# Patient Record
Sex: Male | Born: 1965 | Race: Black or African American | Hispanic: No | Marital: Single | Smoking: Current some day smoker
Health system: Southern US, Community
[De-identification: ages and names within clinical notes are randomized; demographics above are authoritative.]

## PROBLEM LIST (undated history)

## (undated) DIAGNOSIS — G4733 Obstructive sleep apnea (adult) (pediatric): Secondary | ICD-10-CM

## (undated) DIAGNOSIS — Z9989 Dependence on other enabling machines and devices: Secondary | ICD-10-CM

## (undated) DIAGNOSIS — F1910 Other psychoactive substance abuse, uncomplicated: Secondary | ICD-10-CM

---

## 1998-05-11 HISTORY — PX: KNEE SURGERY: SHX244

## 2010-10-19 ENCOUNTER — Emergency Department (HOSPITAL_COMMUNITY)
Admission: EM | Admit: 2010-10-19 | Discharge: 2010-10-19 | Disposition: A | Discharge status: Home / Self Care | Payer: Self-pay | Attending: Emergency Medicine | Admitting: Emergency Medicine

## 2010-10-19 ENCOUNTER — Emergency Department (HOSPITAL_COMMUNITY): Payer: Self-pay

## 2010-10-19 DIAGNOSIS — F3289 Other specified depressive episodes: Secondary | ICD-10-CM | POA: Insufficient documentation

## 2010-10-19 DIAGNOSIS — F329 Major depressive disorder, single episode, unspecified: Secondary | ICD-10-CM | POA: Insufficient documentation

## 2010-10-19 DIAGNOSIS — R42 Dizziness and giddiness: Secondary | ICD-10-CM | POA: Insufficient documentation

## 2010-10-19 DIAGNOSIS — R55 Syncope and collapse: Secondary | ICD-10-CM | POA: Insufficient documentation

## 2010-10-19 DIAGNOSIS — R51 Headache: Secondary | ICD-10-CM | POA: Insufficient documentation

## 2010-10-19 DIAGNOSIS — I10 Essential (primary) hypertension: Secondary | ICD-10-CM | POA: Insufficient documentation

## 2010-10-19 DIAGNOSIS — R232 Flushing: Secondary | ICD-10-CM | POA: Insufficient documentation

## 2010-10-19 LAB — TSH: TSH: 1.053 u[IU]/mL (ref 0.350–4.500)

## 2010-10-19 LAB — POCT I-STAT, CHEM 8
BUN: 16 mg/dL (ref 6–23)
Calcium, Ion: 1.03 mmol/L — ABNORMAL LOW (ref 1.12–1.32)
HCT: 44 % (ref 39.0–52.0)
Hemoglobin: 15 g/dL (ref 13.0–17.0)
Sodium: 140 mEq/L (ref 135–145)
TCO2: 22 mmol/L (ref 0–100)

## 2011-02-11 ENCOUNTER — Ambulatory Visit (INDEPENDENT_AMBULATORY_CARE_PROVIDER_SITE_OTHER): Payer: Self-pay | Admitting: Pulmonary Disease

## 2011-02-11 ENCOUNTER — Encounter: Payer: Self-pay | Admitting: Pulmonary Disease

## 2011-02-11 VITALS — BP 120/70 | HR 85 | Temp 98.2°F | Ht 71.0 in | Wt 391.4 lb

## 2011-02-11 DIAGNOSIS — R0609 Other forms of dyspnea: Secondary | ICD-10-CM

## 2011-02-11 DIAGNOSIS — R0683 Snoring: Secondary | ICD-10-CM

## 2011-02-11 DIAGNOSIS — G4733 Obstructive sleep apnea (adult) (pediatric): Secondary | ICD-10-CM | POA: Insufficient documentation

## 2011-02-11 NOTE — Progress Notes (Signed)
  Subjective:    Patient ID: Jesse Bass, male    DOB: 1965-07-28, 45 y.o.   MRN: [id number]  HPI The patient is a 45 year old male who comes in today as a self-referral for evaluation of possible sleep apnea.  He is morbidly obese, and his fiance has commented on loud snoring as well as an abnormal breathing pattern during sleep.  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  The majority of the mornings, the patient is not rested upon arising.  However, he feels that his alertness during the day is completely normal.  He denies issues with sleepiness in the evening while watching television, and denies any sleepiness while driving.  The patient states that his weight is up over the last 2 years, but he is unsure how much.  His Epworth sleepiness score today is only 4.  Sleep Questionnaire: What time do you typically go to bed?( Between what hours) 10 pm to 11:30 pm How long does it take you to fall asleep? immediately How many times during the night do you wake up? 0 What time do you get out of bed to start your day? 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 (In pounds) Have you ever had a sleep study before? No Do you currently use CPAP? No Do you wear oxygen at any time? No    Review of Systems  Constitutional: Negative for fever and unexpected weight change.  HENT: Negative for ear pain, nosebleeds, congestion, sore throat, rhinorrhea, sneezing, trouble swallowing, dental problem, postnasal drip and sinus pressure.   Eyes: Negative for redness and itching.  Respiratory: Negative for cough, chest tightness, shortness of breath and wheezing.   Cardiovascular: Positive for palpitations. Negative for leg swelling.  Gastrointestinal: Negative for nausea and vomiting.  Genitourinary: Negative for dysuria.  Musculoskeletal: Negative  for joint swelling.  Skin: Negative for rash.  Neurological: Negative for headaches.  Hematological: Does not bruise/bleed easily.  Psychiatric/Behavioral: Negative for dysphoric mood. The patient is not nervous/anxious.        Objective:   Physical Exam Constitutional:  Obese male, no acute distress  HENT:  Nares patent without discharge  Oropharynx without exudate, palate and uvula are thick and elongated.  Eyes:  Perrla, eomi, no scleral icterus  Neck:  No JVD, no TMG  Cardiovascular:  Normal rate, regular rhythm, no rubs or gallops.  No murmurs        Intact distal pulses  Pulmonary :  Normal breath sounds, no stridor or respiratory distress   No rales, rhonchi, or wheezing  Abdominal:  Soft, nondistended, bowel sounds present.  No tenderness noted.   Musculoskeletal:  No lower extremity edema noted.  Lymph Nodes:  No cervical lymphadenopathy noted  Skin:  No cyanosis noted  Neurologic:  Alert, appropriate, moves all 4 extremities without obvious deficit.         Assessment & Plan:

## 2011-02-11 NOTE — Patient Instructions (Signed)
Will do home sleep testing, and call you with results when available.  Work on weight loss

## 2011-02-11 NOTE — Assessment & Plan Note (Signed)
The patient's history is very suggestive of obstructive sleep apnea, however he denies having significant daytime sleepiness.  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  I have had a long discussion with the pt about sleep apnea, including its impact on QOL and CV health.  We'll schedule the patient for a home sleep study given the fact that he does not have insurance.  I feel comfortable using this for evaluation provided all of the measured parameters are adequate at the time of the review of his data.  I have also encouraged the patient to work aggressively on weight loss.

## 2011-02-13 ENCOUNTER — Telehealth: Payer: Self-pay | Admitting: Pulmonary Disease

## 2011-02-13 NOTE — Telephone Encounter (Signed)
Called, spoke with pt.  He is requesting results of home sleep study done 2 nights ago.  Dr. Shelle Iron, have you seen these results yet?

## 2011-02-16 ENCOUNTER — Other Ambulatory Visit: Payer: Self-pay | Admitting: Pulmonary Disease

## 2011-02-16 DIAGNOSIS — G4733 Obstructive sleep apnea (adult) (pediatric): Secondary | ICD-10-CM

## 2011-02-16 NOTE — Telephone Encounter (Signed)
I spoke with pt and he states he would like to be set up on cpap. Please advise Dr. Shelle Iron, thanks  Carver Fila, CMA

## 2011-02-16 NOTE — Telephone Encounter (Signed)
Have tried to call this pt on his only number provided.  Please continue to try and let him know he has severe sleep apnea, and needs to be started on cpap.  If he is agreeable, let me know and I can set up.

## 2011-02-16 NOTE — Telephone Encounter (Signed)
Order sent to [facility].  

## 2011-02-17 NOTE — Telephone Encounter (Signed)
Pt is aware.  

## 2011-02-18 ENCOUNTER — Ambulatory Visit (INDEPENDENT_AMBULATORY_CARE_PROVIDER_SITE_OTHER): Payer: Self-pay | Admitting: Pulmonary Disease

## 2011-02-18 ENCOUNTER — Ambulatory Visit: Payer: Self-pay | Admitting: Pulmonary Disease

## 2011-02-18 DIAGNOSIS — G4733 Obstructive sleep apnea (adult) (pediatric): Secondary | ICD-10-CM

## 2011-02-18 DIAGNOSIS — R0683 Snoring: Secondary | ICD-10-CM

## 2011-02-20 ENCOUNTER — Telehealth: Payer: Self-pay | Admitting: Pulmonary Disease

## 2011-02-20 NOTE — Telephone Encounter (Signed)
Pt is aware we are working to see what's going on. Will forward to [place] so she can document. Please advise rhonda, thanks  Carver Fila, CMA

## 2011-02-20 NOTE — Telephone Encounter (Signed)
lmomtcb to advise rhonda is working on this to see what is going on.

## 2011-02-20 NOTE — Telephone Encounter (Signed)
Pt called back again- pt says he is "perturbed" and wants nurse to call asap. Hazel Sams

## 2011-02-20 NOTE — Telephone Encounter (Signed)
2nd request given to [place] today with [facility] [place]. She is checking on status of order. I will contact patient and let him know, once I hear back for Lecretia.

## 2011-02-20 NOTE — Telephone Encounter (Signed)
Spoke with Mayra Reel at [facility] and she stated that [facility] contacted the patient and mailed patient out a financial form. Pt is aware to complete this and return to [facility]. Once this has been completed [facility] will be able to get patient set up on CPAP. I spoke with patient and he is aware of this. [facility] will contact him to once information has been received. Rhonda J Cobb

## 2011-03-17 ENCOUNTER — Ambulatory Visit (INDEPENDENT_AMBULATORY_CARE_PROVIDER_SITE_OTHER): Payer: Self-pay | Admitting: Pulmonary Disease

## 2011-03-17 ENCOUNTER — Encounter: Payer: Self-pay | Admitting: Pulmonary Disease

## 2011-03-17 ENCOUNTER — Telehealth: Payer: Self-pay | Admitting: Pulmonary Disease

## 2011-03-17 VITALS — BP 140/100 | HR 89 | Temp 98.3°F | Ht 71.0 in | Wt 389.4 lb

## 2011-03-17 DIAGNOSIS — G4733 Obstructive sleep apnea (adult) (pediatric): Secondary | ICD-10-CM

## 2011-03-17 NOTE — Patient Instructions (Signed)
Continue with cpap.  Will optimize pressure on auto mode for the next 2 weeks, and will call you with results.  Work on weight loss followup with me in 6mos.

## 2011-03-17 NOTE — Assessment & Plan Note (Signed)
The pt is much improved on cpap.  He is wearing the device compliantly, and has seen significant improvement in his sleep and daytime alertness.  We have yet to optimize his pressure, and will do this on auto mode for the next 2 weeks.  I have also encouraged him to work aggressively on weight loss Care Plan:  At this point, will arrange for the patient's machine to be changed over to auto mode for 2 weeks to optimize their pressure.  I will review the downloaded data once sent by dme, and also evaluate for compliance, leaks, and residual osa.  I will call the patient and dme to discuss the results, and have the patient's machine set appropriately.  This will serve as the pt's cpap pressure titration.

## 2011-03-17 NOTE — Progress Notes (Signed)
  Subjective:    Patient ID: Jesse Bass, male    DOB: February 12, 1966, 45 y.o.   MRN: [id number]  HPI The patient comes in today for followup of his severe objective sleep apnea.  He has been started on CPAP for an AHI of 87 events per hour, and has done well with the device.  His download today shows good compliance, and no significant mask leak.  The patient denies any issues with the mask fit or pressure tolerance.  He feels that he is sleeping much better, and has seen a significant improvement in daytime alertness.  He denies any sleepiness while driving.   Review of Systems  Constitutional: Negative for fever and unexpected weight change.  HENT: Negative for ear pain, nosebleeds, congestion, sore throat, rhinorrhea, sneezing, trouble swallowing, dental problem, postnasal drip and sinus pressure.   Eyes: Negative for redness and itching.  Respiratory: Negative for cough, chest tightness, shortness of breath and wheezing.   Cardiovascular: Negative for palpitations and leg swelling.  Gastrointestinal: Negative for nausea and vomiting.  Genitourinary: Negative for dysuria.  Musculoskeletal: Negative for joint swelling.  Skin: Negative for rash.  Neurological: Negative for headaches.  Hematological: Does not bruise/bleed easily.  Psychiatric/Behavioral: Negative for dysphoric mood. The patient is not nervous/anxious.        Objective:   Physical Exam Obese male in no acute distress No skin breakdown or pressure necrosis from the CPAP mask Lower extremities without edema, no cyanosis noted Alert and oriented, moves all 4 extremities.  Does not appear sleepy.       Assessment & Plan:

## 2011-03-17 NOTE — Telephone Encounter (Signed)
Called and spoke with pt.  Informed him download from cpap that i got off his machine today will be faxed to # he provided.

## 2011-03-17 NOTE — Telephone Encounter (Signed)
PT CALLED AGAIN. ADDS THAT THE CPAP DATA NEEDS TO BE FAXED ASAP "TODAY" TO [employer]- ATTN: ELOISE. FAX # [phone]. PT SAYS THIS IS "VERY URGENT" SO THAT HE CAN GET A JOB ASAP. Hazel Sams

## 2011-03-19 ENCOUNTER — Telehealth: Payer: Self-pay | Admitting: Pulmonary Disease

## 2011-03-19 NOTE — Telephone Encounter (Signed)
Pt stated that this is imperative to be completed ASAP so that he can get on the road on Saturday.  Pt stated if this could be completed at least by tomorrow, he would greatly appreciate it.    Jesse Bass

## 2011-03-19 NOTE — Telephone Encounter (Signed)
lmomtcb x1 

## 2011-03-20 NOTE — Telephone Encounter (Signed)
Per LORI's note at 9:18, it appears everything is ok with his transportation company.  If not, then I need to know.  If there are issues with DOT, i need to know what they are and what are their expectations.   If advanced put his machine on auto, then it should be self adjusting itself accordingly.   I will get a download in 2 weeks for final pressure adjustment.

## 2011-03-20 NOTE — Telephone Encounter (Signed)
PT CALLED BACK. SAYS HE NEEDS TO SPEAK TO NURSE OR [facility] ASAP "BECAUSE DOT IS GIVING HIM HELL ABOUT THIS". Hazel Sams

## 2011-03-20 NOTE — Telephone Encounter (Signed)
Per Ms. Lucky Cowboy with [employer], the pt's last report shows that the AHI was 38 and per DOT regulations this needs to be below 20 if pt is on therapy. She has received the form that Dr. Shelle Iron filled out stating that the pt is okay to drive and his pressure will be optimized. She stated she will need to know when this has been done for their records.

## 2011-03-20 NOTE — Telephone Encounter (Signed)
Pt still hasn't heard anything. Would like someone to call him back asap. June Leap Lilly

## 2011-03-20 NOTE — Telephone Encounter (Signed)
Called and spoke with pt and  He stated that--he spoke with [facility] and they will download his AHI and send this to the transportation company.  Pt stated that he is feeling great and he is not understanding why the company is still giving him a hard time.  Pt stated that he will call back if anything further is needed.

## 2011-03-20 NOTE — Telephone Encounter (Signed)
Pt calling again in reference to previous message also states that [facility] changed the usage on his cpap machine re: running slow wants to know if dot has spoken with [facility].Juanita Jinny Sanders

## 2011-03-24 ENCOUNTER — Ambulatory Visit: Payer: Self-pay | Admitting: Pulmonary Disease

## 2011-03-25 ENCOUNTER — Encounter: Payer: Self-pay | Admitting: Pulmonary Disease

## 2011-06-07 ENCOUNTER — Other Ambulatory Visit: Payer: Self-pay | Admitting: Pulmonary Disease

## 2011-06-07 DIAGNOSIS — G4733 Obstructive sleep apnea (adult) (pediatric): Secondary | ICD-10-CM

## 2011-06-08 ENCOUNTER — Telehealth: Payer: Self-pay | Admitting: Pulmonary Disease

## 2011-06-08 NOTE — Telephone Encounter (Signed)
LMOMTCB x 1 

## 2011-06-09 NOTE — Telephone Encounter (Signed)
Pt returned call. He asks that nurse leave info on his phone so he doesn't have to call back.

## 2011-06-09 NOTE — Telephone Encounter (Signed)
Left a detailed msg per pt request on VM. I asked that the pt call us if he has any questions or if having any problems with the new pressure setting.

## 2011-06-09 NOTE — Telephone Encounter (Signed)
Looks like [facility] sent order to [facility] stating the following: Let dme and pt know that optimal pressure is 18. Would like to increase him to 14 for 2-3 weeks, then to 18cm and see how he does. He is to call if having issues.  I have LMTCBx2 to advise the pt. Carron Curie, CMA

## 2011-06-15 ENCOUNTER — Telehealth: Payer: Self-pay | Admitting: Pulmonary Disease

## 2011-06-15 NOTE — Telephone Encounter (Signed)
Order has been re-faxed to [facility] and also placed in [facility] folder for [place] marked faxed.  Unable to reach the patient at phone number given because it is temporarily out of service and home number listed does not have a mailbox to leave messages. Will try to reach patient again later.

## 2011-06-16 NOTE — Telephone Encounter (Signed)
Still unable to reach patient to advise order has been sent. Will try again later.

## 2011-06-17 NOTE — Telephone Encounter (Signed)
lmomtcb x1 

## 2011-06-18 NOTE — Telephone Encounter (Signed)
Pt aware. Jennifer Castillo, CMA  

## 2012-12-06 ENCOUNTER — Telehealth: Payer: Self-pay | Admitting: Pulmonary Disease

## 2012-12-06 NOTE — Telephone Encounter (Signed)
Pt stated he needs a new mask for his CPAP bc his broke. He uses [facility], but can't afford a new one. Is there anywhere he can get one for cheaper?  Pt states that mask broke today and he cannot afford a new one through [facility]. Pt states that he got his CPAP machine through charity with [facility] and did not have to pay for machine or mask. Pt requesting information about where he might be able to get one a discounted cost or cheaper than with [facility]. 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   Pt aware that [facility] out of office until 2nd week of August. Pt aware that message to be sent to Dr Maple Hudson to address.  Please advise Dr Maple Hudson if there is nay information that can be given to patient. Thanks.

## 2012-12-07 NOTE — Telephone Encounter (Signed)
i asked Rhonda to see if sleep ctr had a mask he could have.

## 2012-12-07 NOTE — Telephone Encounter (Signed)
Spoke with patient and he stated that [facility] contacted him and he has been provided a cpap mask. Rhonda J Cobb

## 2013-03-15 ENCOUNTER — Telehealth: Payer: Self-pay | Admitting: Pulmonary Disease

## 2013-03-15 NOTE — Telephone Encounter (Signed)
Pt calling again in ref to previous msg can be reached at [phone].Jesse Bass

## 2013-03-15 NOTE — Telephone Encounter (Signed)
Spoke with the pt and he is having several issues with his cpap. He states he has also begun top snore a lot again. Pt last seen 03-2011 so I advise he needs an appt. Appt set this Friday at 11:45. Carron Curie, CMA

## 2013-03-17 ENCOUNTER — Encounter: Payer: Self-pay | Admitting: Pulmonary Disease

## 2013-03-17 ENCOUNTER — Ambulatory Visit (INDEPENDENT_AMBULATORY_CARE_PROVIDER_SITE_OTHER): Payer: Self-pay | Admitting: Pulmonary Disease

## 2013-03-17 VITALS — BP 144/96 | HR 64 | Temp 98.2°F | Ht 71.0 in | Wt 346.0 lb

## 2013-03-17 DIAGNOSIS — G4733 Obstructive sleep apnea (adult) (pediatric): Secondary | ICD-10-CM

## 2013-03-17 NOTE — Progress Notes (Signed)
  Subjective:    Patient ID: Jesse Bass, male    DOB: 1966/05/11, 47 y.o.   MRN: [id number]  HPI The patient comes in today for followup of his obstructive sleep apnea.  His download shows excellent compliance, good control of his sleep apnea, and no significant mask leaks.  His current mask is only 2 months old.  He has been having trouble with memory loss and other cognitive issues, and he is wondering if this is coming from his sleep apnea.  He does not feel as rested as he has been in the past, and is having increased daytime fatigue with some sleepiness.  Of note, his weight is down from his previous visit.   Review of Systems  Constitutional: Positive for fatigue and unexpected weight change. Negative for fever.  HENT: Negative for congestion, dental problem, ear pain, nosebleeds, postnasal drip, rhinorrhea, sinus pressure, sneezing, sore throat and trouble swallowing.   Eyes: Negative for redness and itching.  Respiratory: Negative for cough, chest tightness, shortness of breath and wheezing.   Cardiovascular: Negative for palpitations and leg swelling.  Gastrointestinal: Negative for nausea and vomiting.  Genitourinary: Negative for dysuria.  Musculoskeletal: Negative for joint swelling.  Skin: Negative for rash.  Neurological: Negative for headaches.  Hematological: Does not bruise/bleed easily.  Psychiatric/Behavioral: Positive for confusion ( memory loss). Negative for dysphoric mood. The patient is not nervous/anxious.        Objective:   Physical Exam Morbidly obese male in no acute distress Nose without purulence or discharge noted No skin breakdown or pressure necrosis from the CPAP mask Neck without lymphadenopathy or thyromegaly Lower extremities with edema noted, no cyanosis Alert and oriented, moves all 4 extremities.        Assessment & Plan:

## 2013-03-17 NOTE — Patient Instructions (Signed)
Your download shows great compliance, and good control of your sleep apnea.  I suspect some of your symptoms are due to other things. Will see if we can get you referred to primary care at [facility]. Keep working on weight loss. Keep up with mask cushion changes, and supplies. followup with me in one year if doing well, but call if cpap issues.

## 2013-03-17 NOTE — Assessment & Plan Note (Signed)
The patient has been wearing CPAP compliantly by his download, with excellent control of his sleep apnea and no significant mask leaks.  I suspect that some of his symptoms that he is describing are secondary to another process.  I have asked him to continue working on weight loss, and to make sure that he keeps up with his mask cushion changes and supplies.

## 2013-06-06 ENCOUNTER — Telehealth: Payer: Self-pay | Admitting: Pulmonary Disease

## 2013-06-06 DIAGNOSIS — G4733 Obstructive sleep apnea (adult) (pediatric): Secondary | ICD-10-CM

## 2013-06-06 NOTE — Telephone Encounter (Signed)
Order has been placed for filters. Pt is aware. Nothing further was needed.

## 2014-01-20 ENCOUNTER — Encounter (HOSPITAL_COMMUNITY): Payer: Self-pay | Admitting: Emergency Medicine

## 2014-01-20 ENCOUNTER — Emergency Department (HOSPITAL_COMMUNITY)
Admission: EM | Admit: 2014-01-20 | Discharge: 2014-01-20 | Disposition: A | Discharge status: Home / Self Care | Payer: Self-pay | Attending: Emergency Medicine | Admitting: Emergency Medicine

## 2014-01-20 DIAGNOSIS — IMO0002 Reserved for concepts with insufficient information to code with codable children: Secondary | ICD-10-CM | POA: Insufficient documentation

## 2014-01-20 DIAGNOSIS — F17200 Nicotine dependence, unspecified, uncomplicated: Secondary | ICD-10-CM | POA: Insufficient documentation

## 2014-01-20 DIAGNOSIS — Y929 Unspecified place or not applicable: Secondary | ICD-10-CM | POA: Insufficient documentation

## 2014-01-20 DIAGNOSIS — Y9389 Activity, other specified: Secondary | ICD-10-CM | POA: Insufficient documentation

## 2014-01-20 DIAGNOSIS — S61209A Unspecified open wound of unspecified finger without damage to nail, initial encounter: Secondary | ICD-10-CM | POA: Insufficient documentation

## 2014-01-20 DIAGNOSIS — S61219A Laceration without foreign body of unspecified finger without damage to nail, initial encounter: Secondary | ICD-10-CM

## 2014-01-20 MED ORDER — CEPHALEXIN 500 MG PO CAPS: 500.0000 mg | ORAL_CAPSULE | Freq: Four times a day (QID) | ORAL | Status: DC

## 2014-01-20 MED ORDER — HYDROCODONE-ACETAMINOPHEN 5-325 MG PO TABS: 1.0000 | ORAL_TABLET | ORAL | Status: DC | PRN

## 2014-01-20 NOTE — ED Notes (Addendum)
Pt reports punching a mirror 20 minutes prior to arrival. Pt reports this was intentional, pt denies SI or HI, however got angry with his wife and had a sudden burst of anger. Pt has large laceration to the right index finger that is bleeding heavily upon presentation to department. Pressure dressing has been applied. Pt became lightheaded upon presentation to department. Pt reports using ETOH.

## 2014-01-20 NOTE — Discharge Instructions (Signed)
Read the information below.  Use the prescribed medication as directed.  Please discuss all new medications with your pharmacist.  Do not take additional tylenol while taking the prescribed pain medication to avoid overdose.  You may return to the Emergency Department at any time for worsening condition or any new symptoms that concern you.  If you develop redness, swelling, pus draining from the wound, or fevers greater than 100.4, return to the ER immediately for a recheck.    Laceration Care, Adult A laceration is a cut or lesion that goes through all layers of the skin and into the tissue just beneath the skin. TREATMENT  Some lacerations may not require closure. Some lacerations may not be able to be closed due to an increased risk of infection. It is important to see your caregiver as soon as possible after an injury to minimize the risk of infection and maximize the opportunity for successful closure. If closure is appropriate, pain medicines may be given, if needed. The wound will be cleaned to help prevent infection. Your caregiver will use stitches (sutures), staples, wound glue (adhesive), or skin adhesive strips to repair the laceration. These tools bring the skin edges together to allow for faster healing and a better cosmetic outcome. However, all wounds will heal with a scar. Once the wound has healed, scarring can be minimized by covering the wound with sunscreen during the day for 1 full year. HOME CARE INSTRUCTIONS  For sutures or staples:  Keep the wound clean and dry.  If you were given a bandage (dressing), you should change it at least once a day. Also, change the dressing if it becomes wet or dirty, or as directed by your caregiver.  Wash the wound with soap and water 2 times a day. Rinse the wound off with water to remove all soap. Pat the wound dry with a clean towel.  After cleaning, apply a thin layer of the antibiotic ointment as recommended by your caregiver. This will  help prevent infection and keep the dressing from sticking.  You may shower as usual after the first 24 hours. Do not soak the wound in water until the sutures are removed.  Only take over-the-counter or prescription medicines for pain, discomfort, or fever as directed by your caregiver.  Get your sutures or staples removed as directed by your caregiver. For skin adhesive strips:  Keep the wound clean and dry.  Do not get the skin adhesive strips wet. You may bathe carefully, using caution to keep the wound dry.  If the wound gets wet, pat it dry with a clean towel.  Skin adhesive strips will fall off on their own. You may trim the strips as the wound heals. Do not remove skin adhesive strips that are still stuck to the wound. They will fall off in time. For wound adhesive:  You may briefly wet your wound in the shower or bath. Do not soak or scrub the wound. Do not swim. Avoid periods of heavy perspiration until the skin adhesive has fallen off on its own. After showering or bathing, gently pat the wound dry with a clean towel.  Do not apply liquid medicine, cream medicine, or ointment medicine to your wound while the skin adhesive is in place. This may loosen the film before your wound is healed.  If a dressing is placed over the wound, be careful not to apply tape directly over the skin adhesive. This may cause the adhesive to be pulled off before the  wound is healed.  Avoid prolonged exposure to sunlight or tanning lamps while the skin adhesive is in place. Exposure to ultraviolet light in the first year will darken the scar.  The skin adhesive will usually remain in place for 5 to 10 days, then naturally fall off the skin. Do not pick at the adhesive film. You may need a tetanus shot if:  You cannot remember when you had your last tetanus shot.  You have never had a tetanus shot. If you get a tetanus shot, your arm may swell, get red, and feel warm to the touch. This is common  and not a problem. If you need a tetanus shot and you choose not to have one, there is a rare chance of getting tetanus. Sickness from tetanus can be serious. SEEK MEDICAL CARE IF:   You have redness, swelling, or increasing pain in the wound.  You see a red line that goes away from the wound.  You have yellowish-white fluid (pus) coming from the wound.  You have a fever.  You notice a bad smell coming from the wound or dressing.  Your wound breaks open before or after sutures have been removed.  You notice something coming out of the wound such as wood or glass.  Your wound is on your hand or foot and you cannot move a finger or toe. SEEK IMMEDIATE MEDICAL CARE IF:   Your pain is not controlled with prescribed medicine.  You have severe swelling around the wound causing pain and numbness or a change in color in your arm, hand, leg, or foot.  Your wound splits open and starts bleeding.  You have worsening numbness, weakness, or loss of function of any joint around or beyond the wound.  You develop painful lumps near the wound or on the skin anywhere on your body. MAKE SURE YOU:   Understand these instructions.  Will watch your condition.  Will get help right away if you are not doing well or get worse. Document Released: 04/27/2005 Document Revised: 07/20/2011 Document Reviewed: 10/21/2010 [facility] Patient Information 2015 [place], [place]. This information is not intended to replace advice given to you by your health care provider. Make sure you discuss any questions you have with your health care provider.  Stitches, Staples, or Skin Adhesive Strips  Stitches (sutures), staples, and skin adhesive strips hold the skin together as it heals. They will usually be in place for 7 days or less. HOME CARE  Wash your hands with soap and water before and after you touch your wound.  Only take medicine as told by your doctor.  Cover your wound only if your doctor told you to.  Otherwise, leave it open to air.  Do not get your stitches wet or dirty. If they get dirty, dab them gently with a clean washcloth. Wet the washcloth with soapy water. Do not rub. Pat them dry gently.  Do not put medicine or medicated cream on your stitches unless your doctor told you to.  Do not take out your own stitches or staples. Skin adhesive strips will fall off by themselves.  Do not pick at the wound. Picking can cause an infection.  Do not miss your follow-up appointment.  If you have problems or questions, call your doctor. GET HELP RIGHT AWAY IF:   You have a temperature by mouth above 102 F (38.9 C), not controlled by medicine.  You have chills.  You have redness or pain around your stitches.  There is  puffiness (swelling) around your stitches.  You notice fluid (drainage) from your stitches.  There is a bad smell coming from your wound. MAKE SURE YOU:  Understand these instructions.  Will watch your condition.  Will get help if you are not doing well or get worse. Document Released: 02/22/2009 Document Revised: 07/20/2011 Document Reviewed: 02/22/2009 [facility] Patient Information 2015 [place], [place]. This information is not intended to replace advice given to you by your health care provider. Make sure you discuss any questions you have with your health care provider.
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
## 2014-01-20 NOTE — ED Provider Notes (Signed)
Medical screening examination/treatment/procedure(s) were performed by non-physician practitioner and as supervising physician I was immediately available for consultation/collaboration.    Amarrion Pastorino, MD 01/20/14 2330 

## 2014-02-09 ENCOUNTER — Emergency Department (HOSPITAL_COMMUNITY): Payer: Self-pay

## 2014-02-09 ENCOUNTER — Emergency Department (HOSPITAL_COMMUNITY)
Admission: EM | Admit: 2014-02-09 | Discharge: 2014-02-09 | Disposition: A | Discharge status: Home / Self Care | Payer: Self-pay | Attending: Emergency Medicine | Admitting: Emergency Medicine

## 2014-02-09 ENCOUNTER — Encounter (HOSPITAL_COMMUNITY): Payer: Self-pay | Admitting: Emergency Medicine

## 2014-02-09 DIAGNOSIS — R0789 Other chest pain: Secondary | ICD-10-CM | POA: Insufficient documentation

## 2014-02-09 DIAGNOSIS — K2970 Gastritis, unspecified, without bleeding: Secondary | ICD-10-CM | POA: Insufficient documentation

## 2014-02-09 DIAGNOSIS — F10929 Alcohol use, unspecified with intoxication, unspecified: Secondary | ICD-10-CM | POA: Insufficient documentation

## 2014-02-09 DIAGNOSIS — Z7289 Other problems related to lifestyle: Secondary | ICD-10-CM

## 2014-02-09 DIAGNOSIS — Z9889 Other specified postprocedural states: Secondary | ICD-10-CM | POA: Insufficient documentation

## 2014-02-09 DIAGNOSIS — Z789 Other specified health status: Secondary | ICD-10-CM

## 2014-02-09 DIAGNOSIS — Z79899 Other long term (current) drug therapy: Secondary | ICD-10-CM | POA: Insufficient documentation

## 2014-02-09 DIAGNOSIS — Z7982 Long term (current) use of aspirin: Secondary | ICD-10-CM | POA: Insufficient documentation

## 2014-02-09 DIAGNOSIS — E669 Obesity, unspecified: Secondary | ICD-10-CM

## 2014-02-09 DIAGNOSIS — Z720 Tobacco use: Secondary | ICD-10-CM | POA: Insufficient documentation

## 2014-02-09 LAB — COMPREHENSIVE METABOLIC PANEL
ALBUMIN: 3.4 g/dL — AB (ref 3.5–5.2)
ALK PHOS: 74 U/L (ref 39–117)
ALT: 36 U/L (ref 0–53)
ANION GAP: 15 (ref 5–15)
AST: 33 U/L (ref 0–37)
BILIRUBIN TOTAL: 0.7 mg/dL (ref 0.3–1.2)
BUN: 15 mg/dL (ref 6–23)
CHLORIDE: 104 meq/L (ref 96–112)
CO2: 25 mEq/L (ref 19–32)
Calcium: 8.9 mg/dL (ref 8.4–10.5)
Creatinine, Ser: 1.19 mg/dL (ref 0.50–1.35)
GFR calc non Af Amer: 71 mL/min — ABNORMAL LOW (ref >90)
GFR, EST AFRICAN AMERICAN: 82 mL/min — AB (ref >90)
GLUCOSE: 101 mg/dL — AB (ref 70–99)
POTASSIUM: 4.3 meq/L (ref 3.7–5.3)
Sodium: 144 mEq/L (ref 137–147)
Total Protein: 7.8 g/dL (ref 6.0–8.3)

## 2014-02-09 LAB — CBC WITH DIFFERENTIAL/PLATELET
BASOS ABS: 0 10*3/uL (ref 0.0–0.1)
BASOS PCT: 0 % (ref 0–1)
EOS ABS: 0.1 10*3/uL (ref 0.0–0.7)
EOS PCT: 1 % (ref 0–5)
HCT: 42.7 % (ref 39.0–52.0)
Hemoglobin: 14.1 g/dL (ref 13.0–17.0)
LYMPHS ABS: 2.1 10*3/uL (ref 0.7–4.0)
Lymphocytes Relative: 30 % (ref 12–46)
MCH: 28.8 pg (ref 26.0–34.0)
MCHC: 33 g/dL (ref 30.0–36.0)
MCV: 87.1 fL (ref 78.0–100.0)
Monocytes Absolute: 0.8 10*3/uL (ref 0.1–1.0)
Monocytes Relative: 11 % (ref 3–12)
Neutro Abs: 4 10*3/uL (ref 1.7–7.7)
Neutrophils Relative %: 58 % (ref 43–77)
PLATELETS: 160 10*3/uL (ref 150–400)
RBC: 4.9 MIL/uL (ref 4.22–5.81)
RDW: 15.4 % (ref 11.5–15.5)
WBC: 6.9 10*3/uL (ref 4.0–10.5)

## 2014-02-09 LAB — TROPONIN I: Troponin I: <0.3 ng/mL (ref <0.30)

## 2014-02-09 LAB — ETHANOL: Alcohol, Ethyl (B): 126 mg/dL — ABNORMAL HIGH (ref 0–11)

## 2014-02-09 LAB — LIPASE, BLOOD: Lipase: 23 U/L (ref 11–59)

## 2014-02-09 LAB — PRO B NATRIURETIC PEPTIDE: Pro B Natriuretic peptide (BNP): 144.6 pg/mL — ABNORMAL HIGH (ref 0–125)

## 2014-02-09 LAB — APTT: APTT: 26 s (ref 24–37)

## 2014-02-09 MED ORDER — PANTOPRAZOLE SODIUM 40 MG IV SOLR
40.0000 mg | Freq: Once | INTRAVENOUS | Status: AC
  Administered 2014-02-09: 40 mg via INTRAVENOUS
  Filled 2014-02-09: qty 40

## 2014-02-09 MED ORDER — OMEPRAZOLE 20 MG PO CPDR: 20.0000 mg | DELAYED_RELEASE_CAPSULE | Freq: Every day | ORAL | Status: AC

## 2014-02-09 NOTE — Discharge Instructions (Signed)
°Emergency Department Resource Guide °1) Find a Doctor and Pay Out of Pocket °Although you won't have to find out who is covered by your insurance plan, it is a good idea to ask around and get recommendations. You will then need to call the office and see if the doctor you have chosen will accept you as a new patient and what types of options they offer for patients who are self-pay. Some doctors offer discounts or will set up payment plans for their patients who do not have insurance, but you will need to ask so you aren't surprised when you get to your appointment. ° °2) Contact Your Local Health Department °Not all health departments have doctors that can see patients for sick visits, but many do, so it is worth a call to see if yours does. If you don't know where your local health department is, you can check in your phone book. The CDC also has a tool to help you locate your state's health department, and many state websites also have listings of all of their local health departments. ° °3) Find a Walk-in Clinic °If your illness is not likely to be very severe or complicated, you may want to try a walk in clinic. These are popping up all over the country in pharmacies, drugstores, and shopping centers. They're usually staffed by nurse practitioners or physician assistants that have been trained to treat common illnesses and complaints. They're usually fairly quick and inexpensive. However, if you have serious medical issues or chronic medical problems, these are probably not your best option. ° °No Primary Care Doctor: °- Call [facility] at  832-8000 - they can help you locate a primary care doctor that  accepts your insurance, provides certain services, etc. °- Physician Referral Service- 1-800-533-3463 ° °Chronic Pain Problems: °Organization         Address  Phone   Notes  °[facility]  (336) 297-2271 Patients need to be referred by their primary care doctor.  ° °Medication  Assistance: °Organization         Address  Phone   Notes  °[place] [facility] [address] °[place], [place] [address] (336) 641-8030 --Must be a resident of [place] °-- Must have NO insurance coverage whatsoever (no Medicaid/ Medicare, etc.) °-- The pt. MUST have a primary care doctor that directs their care regularly and follows them in the community °  °[facility]  (866) 331-1348   °[employer]  (888) 892-1162   ° °Agencies that provide inexpensive medical care: °Organization         Address  Phone   Notes  °[facility]  (336) 832-8035   °[facility]    (336) 832-7272   °[facility] [address] °[place], [place] [address] (336) 832-4777   °[facility] [address], °[place] (336) 271-4999   °[facility]    (336) 373-0678   °[facility]    (336) 272-1050   °[facility] ° [address], [place] Phone:  (336) 832-4444, Fax:  (336) 832-4440 Hours of Operation:  9 am - 6 pm, M-F.  Also accepts Medicaid/Medicare and self-pay.  °[facility] ° [address], [place] Phone: (336) [phone], Fax: (336) [phone]. Hours of Operation:  8:30 am - 5:30 pm, M-F.  Also accepts Medicaid and self-pay.  °[facility] [place] [address], [place] Phone: (336) 878-6027   °[facility] [address], [place], [place] (336)723-1848, Ext. 123 Mondays & Thursdays: 7-9 AM.  First 15 patients are seen on a first come, first serve basis. °  ° °Medicaid-accepting [place] Providers: ° °Organization         Address  Phone   Notes  °[facility] [address], [place] (336) 641-2100 Also accepts self-pay patients.  °[facility] [address], [place] ° (336) 856-9996   °[facility] [address], [place]  (336) 288-8857   °[facility] [address] [place] [address], [place] (336) 299-7000   °Veita Bland [address], [place]  ° (336) 373-1557 Only accepts [place] Access Medicaid patients after they have their name applied to their card.  ° °Self-Pay (no insurance) in [place]: ° °Organization         Address  Phone   Notes  °Sickle Cell Patients, [facility] [address], [place] (336) 832-1970   °[facility] [address], [place] (336) 832-4400   °[facility] ° [address], [place] (336) 992-4800   °[facility]/Dr. Osei-Bonsu ° [address] [place] [address], [place] or [address], [place] (336) [phone] Phone number for both [place] and [place] locations is the same.  °[facility] [address], [place] (336) 299-0000   °[facility] [address] [place] [address], [place] or [address] (336) 852-7530 °(336) 878-2260   °[facility] [address], [place] (336) 350-1642, phone; (336) 294-5005, fax Sees patients 1st and 3rd Saturday of every month.  Must not qualify for public or private insurance (i.e. Medicaid, Medicare, [place] Health Choice, Veterans' Benefits) • Household income should be no more than 200% of the poverty level •The clinic cannot treat you if you are pregnant or think you are pregnant • Sexually transmitted diseases are not treated at the clinic.  ° ° °Dental Care: °Organization         Address  Phone  Notes  °[place] [facility] [address], [place] (336) 641-6152 Accepts children up to age 21 who are enrolled in Medicaid or [place] Health Choice; pregnant women with a Medicaid card; and children who have applied for Medicaid or [place] Health Choice, but were declined, whose parents can pay a reduced fee at time of service.  °[place]  [facility] [place]  [address], [place] (336) 641-7733 Accepts children up to age 21 who are enrolled in Medicaid or [place] Health Choice; pregnant women with a Medicaid card; and children who have applied for Medicaid or [place] Health Choice, but were declined, whose parents can pay a reduced fee at time of service.  °[facility] ° [address], [place] (336) 641-4533 Patients are seen by appointment only. Walk-ins are not accepted. [facility] will see patients 18 years of age and older. °Monday - Tuesday (8am-5pm) °Most Wednesdays (8:30-5pm) °$30 per visit, cash only  °[facility] ° [address], [place] (336) 641-4533 Patients are seen by appointment only. Walk-ins are not accepted. [facility] will see patients 18 years of age and older. °One   Wednesday Evening (Monthly: Volunteer Based).  $30 per visit, cash only  °[facility]  (919) [phone] for adults; Children under age 4, call Graduate Pediatric Dentistry at ([phone]) [phone]. Children aged 4-14, please call ([phone]) [phone] to request a pediatric application. ° Dental services are provided in all areas of dental care including fillings, crowns and bridges, complete and partial dentures, implants, gum treatment, root canals, and extractions. Preventive care is also provided. Treatment is provided to both adults and children. °Patients are selected via a lottery and there is often a waiting list. °  °[facility] [address], °[place] ° (336) 763-8833 [email or web address] °  °[facility] [address], [place], [place] (336)723-1848, Ext. 123 Second and Fourth Thursday of each month, opens at 6:30 AM; Clinic ends at 9 AM.  Patients are seen on a first-come first-served basis, and a limited number are seen during each clinic.  ° °[facility] ° [address], [place], [place] (336) [phone]    Eligibility Requirements °You must have lived in [place], [place], or [place] counties for at least the last three months. °  You cannot be eligible for state or federal sponsored healthcare insurance, including Veterans Administration, Medicaid, or Medicare. °  You generally cannot be eligible for healthcare insurance through your employer.  °  How to apply: °Eligibility screenings are held every Tuesday and Wednesday afternoon from 1:00 pm until 4:00 pm. You do not need an appointment for the interview!  °[facility] [address], [place], [place] 336-631-2330   °[facility]  336-342-8273   °[facility]  336-703-3100   °[facility]  336-570-6415   ° °Behavioral Health Resources in the Community: °Intensive Outpatient Programs °Organization         Address  Phone  Notes  °[facility] [address], [place], [place] 336-878-6098   °[facility] [address], [place], [place] 336-832-9800   °[facility] [address], [place], [place] ° 336-882-2125   °[facility] [address],  °[place], [place] 1-800-853-5163 or 336-641-4981   °Substance Abuse Resources °Organization         Address  Phone  Notes  °[facility]  336-882-2125   °[facility]  336-784-9470   °[facility]  336-285-9073   °[facility]  336-845-3988   °Residential & Outpatient Substance Abuse Program  1-800-659-3381   °Psychological Services °Organization         Address  Phone  Notes  °[facility]  336- 832-9600   °[facility]  336- 378-7881   °[facility] [address], [place] 1-800-853-5163 or 336-641-4981   ° °Mobile Crisis Teams °Organization         Address  Phone  Notes  °[facility], Mobile Crisis Care Unit  1-877-626-1772   °Assertive °[facility] ° [address]  [place], [place] 336-834-9664   °Sharon DeEsch [address] °[place] 336-554-5454   ° °Self-Help/Support Groups °Organization         Address  Phone             Notes  °[facility]  - variety of support groups  336- 373-1402 Call for more information  °Narcotics Anonymous (NA), [facility] [address], °[place]  2 meetings at this location  ° °  Residential Treatment Programs Organization         Address  Phone  Notes  [facility] [address],    [place]  4-540-981-1914   [facility]  [address] [place] [address], [place], [place] 213-086-5784   [facility] [address] [place], [place] 696-295-2841 Admissions: 8am-3pm M-F  [facility] [address],    [place], [place] 324-401-0272   [facility] [address] [place], [place], [place] 536-644-0347   [facility] [address],  [place], [place] [phone]   [facility] Intensive Outpatient [address], [place], [place] 643-329-5188   [facility] [address] [place]  [place], [place] 4-166-063-0160 or (858) 273-0573   [facility] [address], [place], [place] 220-254-2706 Accepts Medicaid  [facility] [address]  [place] 2-376-283-1517 Substance Abuse/Addiction Treatment   [facility] Organization         Address  Phone  Notes  [facility]  579-151-5544   Angie Fava, PhD [address] [place], [place]   317-244-3774 or 303-102-4329   [facility]   [address] [place], [place] (980)006-9606   [facility] [address] [place], [place] [phone] Insurance/Medicaid/sponsorship through [facility] [address]                                    [place], [place] 252-255-5017 Therapy/tele-psych/case    [facility] [address] [place] [address], [place] (770)[phone]    Dr. Lolly Mustache  ([phone]   [facility]  [employer] [facility] Dept. 1) [address], [place] 2) [address], [place] 3)  [address], [place] (531) [phone]  606-205-3666   [facility] Child Abuse Hotline 463 477 9884 or (504)727-7548 (After Hours)     How Much is Too Much Alcohol? Drinking too much alcohol can cause injury, accidents, and health problems. These types of problems can include:   Car crashes.  Falls.  Family fighting (domestic violence).  Drowning.  Fights.  Injuries.  Burns.  Damage to certain organs.  Having a baby with birth defects. ONE DRINK CAN BE TOO MUCH WHEN YOU ARE:  Working.  Pregnant or breastfeeding.  Taking medicines. Ask your doctor.  Driving or planning to drive. WHAT IS A STANDARD DRINK?   1 regular beer (12 ounces or 360 milliliters).  1 glass of wine (5 ounces or 150 milliliters).  1 shot of liquor (1.5 ounces or 45 milliliters). BLOOD ALCOHOL LEVELS   .00 A person is sober.  Marland Kitchen03 A person has no trouble keeping balance, talking, or seeing right, but a "buzz" may be felt.  Marland Kitchen05 A person feels "buzzed" and relaxed.  Marland Kitchen08 or .10  A person is drunk. He or she has trouble talking, seeing right, and keeping his or her balance.  .15 A person loses body control and may pass out (blackout).  .20 A person has trouble walking (staggering) and throws up (vomits).  .30 A person will pass out (unconscious).  .40+ A person will be in a coma. Death is possible. If you or someone you know has a drinking problem, get help from  a doctor.  Document Released: 02/21/2009 Document Revised: 07/20/2011 Document Reviewed: 02/21/2009 [facility] Patient Information 2015 [place], [place]. This information is not intended to replace advice given to you by your health care provider. Make sure you discuss any  questions you have with your health care provider. Chest Pain (Nonspecific) It is often hard to give a specific diagnosis for the cause of chest pain. There is always a chance that your pain could be related to something serious, such as a heart attack or a blood clot in the lungs. You need to follow up with your health care provider for further evaluation. CAUSES   Heartburn.  Pneumonia or bronchitis.  Anxiety or stress.  Inflammation around your heart (pericarditis) or lung (pleuritis or pleurisy).  A blood clot in the lung.  A collapsed lung (pneumothorax). It can develop suddenly on its own (spontaneous pneumothorax) or from trauma to the chest.  Shingles infection (herpes zoster virus). The chest wall is composed of bones, muscles, and cartilage. Any of these can be the source of the pain.  The bones can be bruised by injury.  The muscles or cartilage can be strained by coughing or overwork.  The cartilage can be affected by inflammation and become sore (costochondritis). DIAGNOSIS  Lab tests or other studies may be needed to find the cause of your pain. Your health care provider may have you take a test called an ambulatory electrocardiogram (ECG). An ECG records your heartbeat patterns over a 24-hour period. You may also have other tests, such as:  Transthoracic echocardiogram (TTE). During echocardiography, sound waves are used to evaluate how blood flows through your heart.  Transesophageal echocardiogram (TEE).  Cardiac monitoring. This allows your health care provider to monitor your heart rate and rhythm in real time.  Holter monitor. This is a portable device that records your heartbeat and can help diagnose heart arrhythmias. It allows your health care provider to track your heart activity for several days, if needed.  Stress tests by exercise or by giving medicine that makes the heart beat faster. TREATMENT   Treatment depends on what may be causing your chest  pain. Treatment may include:  Acid blockers for heartburn.  Anti-inflammatory medicine.  Pain medicine for inflammatory conditions.  Antibiotics if an infection is present.  You may be advised to change lifestyle habits. This includes stopping smoking and avoiding alcohol, caffeine, and chocolate.  You may be advised to keep your head raised (elevated) when sleeping. This reduces the chance of acid going backward from your stomach into your esophagus. Most of the time, nonspecific chest pain will improve within 2-3 days with rest and mild pain medicine.  HOME CARE INSTRUCTIONS   If antibiotics were prescribed, take them as directed. Finish them even if you start to feel better.  For the next few days, avoid physical activities that bring on chest pain. Continue physical activities as directed.  Do not use any tobacco products, including cigarettes, chewing tobacco, or electronic cigarettes.  Avoid drinking alcohol.  Only take medicine as directed by your health care provider.  Follow your health care provider's suggestions for further testing if your chest pain does not go away.  Keep any follow-up appointments you made. If you do not go to an appointment, you could develop lasting (chronic) problems with pain. If there is any problem keeping an appointment, call to reschedule. SEEK MEDICAL CARE IF:   Your chest pain does not go away, even after treatment.  You have a rash with  blisters on your chest.  You have a fever. SEEK IMMEDIATE MEDICAL CARE IF:   You have increased chest pain or pain that spreads to your arm, neck, jaw, back, or abdomen.  You have shortness of breath.  You have an increasing cough, or you cough up blood.  You have severe back or abdominal pain.  You feel nauseous or vomit.  You have severe weakness.  You faint.  You have chills. This is an emergency. Do not wait to see if the pain will go away. Get medical help at once. Call your local  emergency services (911 in [place] Do not drive yourself to the hospital. MAKE SURE YOU:   Understand these instructions.  Will watch your condition.  Will get help right away if you are not doing well or get worse. Document Released: 02/04/2005 Document Revised: 05/02/2013 Document Reviewed: 12/01/2007 [facility] Patient Information 2015 [place]. This information is not intended to replace advice given to you by your health care provider. Make sure you discuss any questions you have with your health care provider.

## 2014-02-09 NOTE — ED Notes (Signed)
Attempted to start IV; unsuccessful

## 2014-02-09 NOTE — ED Notes (Signed)
[facility], pt c/o central CP with no SOB or N/V, 8/10 pain, PTA pt had one nitro and four ASA with no relief of pain. No prior cardiac hx.

## 2014-02-09 NOTE — ED Provider Notes (Signed)
CSN: [id number]     Arrival date & time 02/09/14  0810 History   First MD Initiated Contact with Patient 02/09/14 [phone]     Chief Complaint  Patient presents with  . Chest Pain     (Consider location/radiation/quality/duration/timing/severity/associated sxs/prior Treatment) HPI The patient reports he's had constant chest pain since yesterday. He reports it in the center of his chest. The quality is sharp and aching. The patient reports is made worse if he twists bends or takes a deep breath. He denies any associated symptoms with it. Patient denies any prior history of heart disease. Family history is negative for early coronary disease. The patient reports he doesn't think he needs to be here. He reports he is under a lot of stress. [place] things". He reports he let someone know he was having chest pain and then they insisted he come to the emergency department. He reports he thinks is just his muscles and there is no reason for him to be here. Patient denies any calf pain or swelling. There's been no vomiting. The patient was equivocal about identifying how much alcohol he uses.  History reviewed. No pertinent past medical history. Past Surgical History  Procedure Laterality Date  . Knee surgery  2000    Left   No family history on file. History  Substance Use Topics  . Smoking status: Current Some Day Smoker -- 0.50 packs/day for 27 years    Types: Cigars  . Smokeless tobacco: Not on file     Comment: Smokes black &milds 1-2/week  . Alcohol Use: No    Review of Systems 10 Systems reviewed and are negative for acute change except as noted in the HPI.    Allergies  Review of patient's allergies indicates no known allergies.  Home Medications   Prior to Admission medications   Medication Sig Start Date End Date Taking? Authorizing Provider  aspirin EC 81 MG tablet Take 324 mg by mouth once.    Historical Provider, MD  nitroGLYCERIN  (NITROSTAT) 0.4 MG SL tablet Place 0.4 mg under the tongue once.    Historical Provider, MD  omeprazole (PRILOSEC) 20 MG capsule Take 1 capsule (20 mg total) by mouth daily. 02/09/14   Arby BarretteMarcy Romonda Parker, MD   BP 118/91  Pulse 60  Temp(Src) 98.1 F (36.7 C) (Oral)  Resp 20  Ht 5\' 11"  (1.803 m)  Wt 290 lb (131.543 kg)  BMI 40.46 kg/m2  SpO2 97% Physical Exam  Constitutional: He is oriented to person, place, and time.  Patient is morbidly obese. Alert and nontoxic. No respiratory distress.  HENT:  Head: Normocephalic and atraumatic.  Eyes: EOM are normal.  Neck: Normal range of motion. Neck supple.  Cardiovascular: Normal rate, regular rhythm and normal heart sounds.   Pulmonary/Chest: Effort normal and breath sounds normal. No respiratory distress. He has no wheezes. He has no rales. Tenderness: patient winced and described chest pain as I had him lean forward twist and move.  Abdominal: Soft. Bowel sounds are normal. He exhibits no distension. There is no tenderness. There is no guarding.  Musculoskeletal: Normal range of motion. He exhibits no edema and no tenderness (calves are soft and nontender.).  Neurological: He is alert and oriented to person, place, and time.  All movements are coordinated, he follows commands without difficulty and has full normal motor use.  Skin: Skin is warm and dry.  Psychiatric: He has a normal mood and affect.  ED Course  Procedures (including critical care time) Labs Review Labs Reviewed  COMPREHENSIVE METABOLIC PANEL - Abnormal; Notable for the following:    Glucose, Bld 101 (*)    Albumin 3.4 (*)    GFR calc non Af Amer 71 (*)    GFR calc Af Amer 82 (*)    All other components within normal limits  PRO B NATRIURETIC PEPTIDE - Abnormal; Notable for the following:    Pro B Natriuretic peptide (BNP) 144.6 (*)    All other components within normal limits  ETHANOL - Abnormal; Notable for the following:    Alcohol, Ethyl (B) 126 (*)    All  other components within normal limits  LIPASE, BLOOD  TROPONIN I  CBC WITH DIFFERENTIAL  APTT  CBC WITH DIFFERENTIAL  URINE RAPID DRUG SCREEN (HOSP PERFORMED)  I-STAT TROPOININ, ED    Imaging Review Dg Chest 2 View  02/09/2014   CLINICAL DATA:  Mid chest pain without shortness of breath or nausea/vomiting.  EXAM: CHEST  2 VIEW  COMPARISON:  None.  FINDINGS: The cardiac silhouette is upper limits of normal in size. The lungs are well inflated without evidence of confluent airspace opacity, overt pulmonary edema, pleural effusion, or pneumothorax. No acute osseous abnormality is identified.  IMPRESSION: No evidence of active cardiopulmonary disease.   Electronically Signed   By: Sebastian Ache   On: 02/09/2014 09:24     EKG Interpretation   Date/Time:  Friday February 09 2014 08:27:43 EDT Ventricular Rate:  64 PR Interval:  191 QRS Duration: 99 QT Interval:  432 QTC Calculation: 446 R Axis:   -53 Text Interpretation:  Sinus rhythm Probable left atrial enlargement Left  anterior fascicular block Borderline ST elevation, anterior leads  Confirmed by Donnald Garre, MD, Lebron Conners [phone]) on 02/09/2014 8:58:15 AM      MDM   Final diagnoses:  Atypical chest pain  Alcohol use  Gastritis  Obesity   At this point in time workup is negative for acute ischemic type of event. The patient's pain quality is atypical. Certainly gastritis is a consideration with the patient's alcohol use. I will empiracally treated for that and recommend he get a family physician followup for further evaluation. Patient is advised if he should develop other types of symptoms or worsening of his pain he is to return to emergency Department for reassessment.    Arby Barrette, MD 02/09/14 1126

## 2014-02-09 NOTE — ED Notes (Signed)
Patient returned from xray.

## 2014-02-09 NOTE — ED Notes (Signed)
IV removed from left upper arm.

## 2014-02-13 ENCOUNTER — Emergency Department (HOSPITAL_COMMUNITY)
Admission: EM | Admit: 2014-02-13 | Discharge: 2014-02-13 | Disposition: A | Discharge status: Home / Self Care | Payer: Self-pay | Attending: Emergency Medicine | Admitting: Emergency Medicine

## 2014-02-13 ENCOUNTER — Encounter (HOSPITAL_COMMUNITY): Payer: Self-pay | Admitting: Emergency Medicine

## 2014-02-13 DIAGNOSIS — Z79899 Other long term (current) drug therapy: Secondary | ICD-10-CM | POA: Insufficient documentation

## 2014-02-13 DIAGNOSIS — Z720 Tobacco use: Secondary | ICD-10-CM | POA: Insufficient documentation

## 2014-02-13 DIAGNOSIS — Z4802 Encounter for removal of sutures: Secondary | ICD-10-CM | POA: Insufficient documentation

## 2014-02-13 DIAGNOSIS — Z7982 Long term (current) use of aspirin: Secondary | ICD-10-CM | POA: Insufficient documentation

## 2014-02-13 NOTE — ED Notes (Signed)
Pt has returned to ED to have stitches removed from R pointer finger.

## 2014-02-13 NOTE — ED Provider Notes (Signed)
CSN: [id number]     Arrival date & time 02/13/14  1612 History  This chart was scribed for non-physician practitioner, Oswaldo ConroyVictoria Jameel Quant PA-C, working with Lyanne CoKevin M Campos, MD by Milly JakobJohn Lee Graves, ED Scribe. The patient was seen in room WTR8/WTR8. Patient's care was started at 5:10 PM.     Chief Complaint  Patient presents with  . Suture / Staple Removal   The history is provided by the patient. No language interpreter was used.   HPI Comments: Jesse Bass is a 48 y.o. male who presents to the Emergency Department for suture removal of the 7 stitches in his right pointer finger. He state that his laceration was repaired on September 12th. He denies any fever, chills, nausea, vomiting, erythema, discharge, or pain to the area. No other problems.  History reviewed. No pertinent past medical history. Past Surgical History  Procedure Laterality Date  . Knee surgery  2000    Left   No family history on file. History  Substance Use Topics  . Smoking status: Current Some Day Smoker -- 0.50 packs/day for 27 years    Types: Cigars  . Smokeless tobacco: Not on file     Comment: Smokes black &milds 1-2/week  . Alcohol Use: No    Review of Systems  Constitutional: Negative for fever and chills.  Gastrointestinal: Negative for nausea and vomiting.  Skin: Positive for wound (repaired laceration on right pointer finger).   Allergies  Review of patient's allergies indicates no known allergies.  Home Medications   Prior to Admission medications   Medication Sig Start Date End Date Taking? Authorizing Provider  aspirin EC 81 MG tablet Take 324 mg by mouth once.    Historical Provider, MD  nitroGLYCERIN (NITROSTAT) 0.4 MG SL tablet Place 0.4 mg under the tongue once.    Historical Provider, MD  omeprazole (PRILOSEC) 20 MG capsule Take 1 capsule (20 mg total) by mouth daily. 02/09/14   Arby BarretteMarcy Pfeiffer, MD   Triage Vitals: P 152/97  Pulse 100  Temp(Src) 98.2 F (36.8 C) (Oral)  Resp 16  SpO2  98% Physical Exam  Nursing note and vitals reviewed. Constitutional: He appears well-developed and well-nourished. No distress.  HENT:  Head: Normocephalic and atraumatic.  Eyes: Conjunctivae are normal. Right eye exhibits no discharge. Left eye exhibits no discharge.  Pulmonary/Chest: Effort normal. No respiratory distress.  Neurological: He is alert. Coordination normal.  Skin: He is not diaphoretic.  4 CM laceration to the dorsum of the right second finger. Clear, dry, intact. No erythema, edema, or induration. Healing appropriately.  Psychiatric: He has a normal mood and affect. His behavior is normal.    ED Course  Procedures (including critical care time)  COORDINATION OF CARE: 5:16 PM-Discussed treatment plan which includes suture removal with pt at bedside and pt agreed to plan.   Labs Review Labs Reviewed - No data to display  Imaging Review No results found.   EKG Interpretation None      SUTURE REMOVAL Performed by: Louann SjogrenVictoria L Shemeika Starzyk  Consent: Verbal consent obtained. Patient identity confirmed: provided demographic data Time out: Immediately prior to procedure a "time out" was called to verify the correct patient, procedure, equipment, support staff and site/side marked as required.  Location details: rdorsal second finger of right hand.  Wound Appearance: clean  Sutures/Staples Removed: 7  Facility: sutures placed in this facility Patient tolerance: Patient tolerated the procedure well with no immediate complications.     MDM   Final diagnoses:  Encounter  for removal of sutures   Patient presents for suture removal. The wound is well healed without signs of infection.  The sutures are removed. Wound care and activity instructions given. Scar minimization given as well. Return prn.  Discussed return precautions with patient. Discussed all results and patient verbalizes understanding and agrees with plan.  I personally performed the services  described in this documentation, which was scribed in my presence. The recorded information has been reviewed and is accurate.  Louann Sjogren, PA-C 02/13/14 2238

## 2014-02-13 NOTE — Discharge Instructions (Signed)
Return to the emergency room with worsening of symptoms, new symptoms or with symptoms that are concerning. ° ° °Suture Removal, Care After °Refer to this sheet in the next few weeks. These instructions provide you with information on caring for yourself after your procedure. Your health care provider may also give you more specific instructions. Your treatment has been planned according to current medical practices, but problems sometimes occur. Call your health care provider if you have any problems or questions after your procedure. °WHAT TO EXPECT AFTER THE PROCEDURE °After your stitches (sutures) are removed, it is typical to have the following: °· Some discomfort and swelling in the wound area. °· Slight redness in the area. °HOME CARE INSTRUCTIONS  °· If you have skin adhesive strips over the wound area, do not take the strips off. They will fall off on their own in a few days. If the strips remain in place after 14 days, you may remove them. °· Change any bandages (dressings) at least once a day or as directed by your health care provider. If the bandage sticks, soak it off with warm, soapy water. °· Apply cream or ointment only as directed by your health care provider. If using cream or ointment, wash the area with soap and water 2 times a day to remove all the cream or ointment. Rinse off the soap and pat the area dry with a clean towel. °· Keep the wound area dry and clean. If the bandage becomes wet or dirty, or if it develops a bad smell, change it as soon as possible. °· Continue to protect the wound from injury. °· Use sunscreen when out in the sun. New scars become sunburned easily. °SEEK MEDICAL CARE IF: °· You have increasing redness, swelling, or pain in the wound. °· You see pus coming from the wound. °· You have a fever. °· You notice a bad smell coming from the wound or dressing. °· Your wound breaks open (edges not staying together). °Document Released: 01/20/2001 Document Revised: 02/15/2013  Document Reviewed: 12/07/2012 °[employer]® Patient Information ©2015 [employer]. This information is not intended to replace advice given to you by your health care provider. Make sure you discuss any questions you have with your health care provider. ° °

## 2014-02-14 NOTE — ED Provider Notes (Signed)
Medical screening examination/treatment/procedure(s) were performed by non-physician practitioner and as supervising physician I was immediately available for consultation/collaboration.   EKG Interpretation None        Fardowsa Authier M Jaskaran Dauzat, MD 02/14/14 0102 

## 2016-05-16 ENCOUNTER — Encounter (HOSPITAL_COMMUNITY): Payer: Self-pay | Admitting: Emergency Medicine

## 2016-05-16 ENCOUNTER — Emergency Department (HOSPITAL_COMMUNITY)
Admission: EM | Admit: 2016-05-16 | Discharge: 2016-05-16 | Disposition: A | Discharge status: Home / Self Care | Payer: No Typology Code available for payment source | Attending: Emergency Medicine | Admitting: Emergency Medicine

## 2016-05-16 DIAGNOSIS — M545 Low back pain, unspecified: Secondary | ICD-10-CM

## 2016-05-16 DIAGNOSIS — Z7982 Long term (current) use of aspirin: Secondary | ICD-10-CM | POA: Diagnosis not present

## 2016-05-16 DIAGNOSIS — Y92410 Unspecified street and highway as the place of occurrence of the external cause: Secondary | ICD-10-CM | POA: Diagnosis not present

## 2016-05-16 DIAGNOSIS — M25512 Pain in left shoulder: Secondary | ICD-10-CM | POA: Diagnosis not present

## 2016-05-16 DIAGNOSIS — Y939 Activity, unspecified: Secondary | ICD-10-CM | POA: Insufficient documentation

## 2016-05-16 DIAGNOSIS — F17290 Nicotine dependence, other tobacco product, uncomplicated: Secondary | ICD-10-CM | POA: Insufficient documentation

## 2016-05-16 DIAGNOSIS — Y999 Unspecified external cause status: Secondary | ICD-10-CM | POA: Insufficient documentation

## 2016-05-16 DIAGNOSIS — M25551 Pain in right hip: Secondary | ICD-10-CM | POA: Insufficient documentation

## 2016-05-16 MED ORDER — METHOCARBAMOL 500 MG PO TABS: 500.0000 mg | ORAL_TABLET | Freq: Every evening | ORAL | 0 refills | Status: AC | PRN

## 2016-05-16 MED ORDER — IBUPROFEN 200 MG PO TABS
600.0000 mg | ORAL_TABLET | Freq: Once | ORAL | Status: AC
  Administered 2016-05-16: 600 mg via ORAL
  Filled 2016-05-16: qty 3
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
## 2016-05-16 NOTE — Discharge Instructions (Signed)
Take anti-inflammatory medicines (Ibuprofen/Aleve) for the next week. Take this medicine with food. Take muscle relaxer at bedtime to help you sleep. This medicine makes you drowsy so do not take before driving or work Use a heating pad for sore muscles - use for 20 minutes several times a day Follow up with Orthopedics if symptoms are worsening
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
## 2016-05-21 ENCOUNTER — Emergency Department (HOSPITAL_COMMUNITY): Payer: No Typology Code available for payment source

## 2016-05-21 ENCOUNTER — Emergency Department (HOSPITAL_COMMUNITY)
Admission: EM | Admit: 2016-05-21 | Discharge: 2016-05-21 | Disposition: A | Discharge status: Home / Self Care | Payer: No Typology Code available for payment source | Attending: Emergency Medicine | Admitting: Emergency Medicine

## 2016-05-21 ENCOUNTER — Encounter (HOSPITAL_COMMUNITY): Payer: Self-pay | Admitting: Emergency Medicine

## 2016-05-21 DIAGNOSIS — Y939 Activity, unspecified: Secondary | ICD-10-CM | POA: Insufficient documentation

## 2016-05-21 DIAGNOSIS — Z7982 Long term (current) use of aspirin: Secondary | ICD-10-CM | POA: Insufficient documentation

## 2016-05-21 DIAGNOSIS — Y999 Unspecified external cause status: Secondary | ICD-10-CM | POA: Diagnosis not present

## 2016-05-21 DIAGNOSIS — M25511 Pain in right shoulder: Secondary | ICD-10-CM | POA: Insufficient documentation

## 2016-05-21 DIAGNOSIS — Y92410 Unspecified street and highway as the place of occurrence of the external cause: Secondary | ICD-10-CM | POA: Insufficient documentation

## 2016-05-21 DIAGNOSIS — S46912A Strain of unspecified muscle, fascia and tendon at shoulder and upper arm level, left arm, initial encounter: Secondary | ICD-10-CM

## 2016-05-21 DIAGNOSIS — F17290 Nicotine dependence, other tobacco product, uncomplicated: Secondary | ICD-10-CM | POA: Diagnosis not present

## 2016-05-21 DIAGNOSIS — S4992XA Unspecified injury of left shoulder and upper arm, initial encounter: Secondary | ICD-10-CM | POA: Diagnosis present

## 2016-05-21 MED ORDER — CYCLOBENZAPRINE HCL 10 MG PO TABS: 10.0000 mg | ORAL_TABLET | Freq: Two times a day (BID) | ORAL | 0 refills | Status: DC | PRN

## 2016-05-21 MED ORDER — DICLOFENAC SODIUM 75 MG PO TBEC: 75.0000 mg | DELAYED_RELEASE_TABLET | Freq: Two times a day (BID) | ORAL | 0 refills | Status: AC

## 2016-05-21 NOTE — ED Notes (Signed)
Pt ambulatory and independent at discharge.  Verbalized understanding of discharge instructions 
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
## 2016-05-21 NOTE — Discharge Instructions (Signed)
Return if any problems.
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
## 2016-08-29 DIAGNOSIS — Z6841 Body Mass Index (BMI) 40.0 and over, adult: Secondary | ICD-10-CM | POA: Insufficient documentation

## 2016-08-29 DIAGNOSIS — E876 Hypokalemia: Secondary | ICD-10-CM | POA: Insufficient documentation

## 2016-08-29 DIAGNOSIS — F1010 Alcohol abuse, uncomplicated: Secondary | ICD-10-CM | POA: Insufficient documentation

## 2016-08-29 DIAGNOSIS — D696 Thrombocytopenia, unspecified: Secondary | ICD-10-CM | POA: Insufficient documentation

## 2016-08-29 DIAGNOSIS — Z8249 Family history of ischemic heart disease and other diseases of the circulatory system: Secondary | ICD-10-CM | POA: Insufficient documentation

## 2016-08-29 DIAGNOSIS — G4733 Obstructive sleep apnea (adult) (pediatric): Secondary | ICD-10-CM | POA: Insufficient documentation

## 2016-08-29 DIAGNOSIS — Z79899 Other long term (current) drug therapy: Secondary | ICD-10-CM | POA: Insufficient documentation

## 2016-08-29 DIAGNOSIS — R55 Syncope and collapse: Principal | ICD-10-CM | POA: Insufficient documentation

## 2016-08-29 DIAGNOSIS — F1210 Cannabis abuse, uncomplicated: Secondary | ICD-10-CM | POA: Insufficient documentation

## 2016-08-29 DIAGNOSIS — I10 Essential (primary) hypertension: Secondary | ICD-10-CM | POA: Insufficient documentation

## 2016-08-29 DIAGNOSIS — F17290 Nicotine dependence, other tobacco product, uncomplicated: Secondary | ICD-10-CM | POA: Insufficient documentation
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
## 2016-08-29 NOTE — ED Notes (Signed)
Preferred name is "Jesse Bass."

## 2016-08-29 NOTE — ED Notes (Signed)
Pt refusing blood draw.

## 2016-08-29 NOTE — ED Triage Notes (Signed)
Pt reports having episode while at work that he had syncopal episode. Pt currently alert and oriented x 4 at this time. Pt states he was smoking cigarette when incident occurred.

## 2016-08-30 ENCOUNTER — Other Ambulatory Visit (HOSPITAL_COMMUNITY): Payer: Self-pay

## 2016-08-30 ENCOUNTER — Emergency Department (HOSPITAL_COMMUNITY): Payer: Self-pay

## 2016-08-30 ENCOUNTER — Encounter (HOSPITAL_COMMUNITY): Payer: Self-pay | Admitting: Radiology

## 2016-08-30 ENCOUNTER — Observation Stay (HOSPITAL_COMMUNITY)
Admission: EM | Admit: 2016-08-30 | Discharge: 2016-08-30 | Disposition: A | Discharge status: Home / Self Care | Payer: Self-pay | Attending: Internal Medicine | Admitting: Internal Medicine

## 2016-08-30 DIAGNOSIS — G4733 Obstructive sleep apnea (adult) (pediatric): Secondary | ICD-10-CM

## 2016-08-30 DIAGNOSIS — I10 Essential (primary) hypertension: Secondary | ICD-10-CM

## 2016-08-30 DIAGNOSIS — D696 Thrombocytopenia, unspecified: Secondary | ICD-10-CM

## 2016-08-30 DIAGNOSIS — E66813 Obesity, class 3: Secondary | ICD-10-CM

## 2016-08-30 DIAGNOSIS — F1910 Other psychoactive substance abuse, uncomplicated: Secondary | ICD-10-CM | POA: Insufficient documentation

## 2016-08-30 DIAGNOSIS — R55 Syncope and collapse: Principal | ICD-10-CM

## 2016-08-30 DIAGNOSIS — E876 Hypokalemia: Secondary | ICD-10-CM

## 2016-08-30 DIAGNOSIS — R600 Localized edema: Secondary | ICD-10-CM

## 2016-08-30 HISTORY — DX: Dependence on other enabling machines and devices: Z99.89

## 2016-08-30 HISTORY — DX: Morbid (severe) obesity due to excess calories: E66.01

## 2016-08-30 HISTORY — DX: Other psychoactive substance abuse, uncomplicated: F19.10

## 2016-08-30 HISTORY — DX: Obstructive sleep apnea (adult) (pediatric): G47.33

## 2016-08-30 LAB — MAGNESIUM: Magnesium: 1.8 mg/dL (ref 1.7–2.4)

## 2016-08-30 LAB — COMPREHENSIVE METABOLIC PANEL
ALBUMIN: 3.4 g/dL — AB (ref 3.5–5.0)
ALT: 18 U/L (ref 17–63)
AST: 22 U/L (ref 15–41)
Alkaline Phosphatase: 61 U/L (ref 38–126)
Anion gap: 6 (ref 5–15)
BILIRUBIN TOTAL: 0.9 mg/dL (ref 0.3–1.2)
BUN: 11 mg/dL (ref 6–20)
CO2: 25 mmol/L (ref 22–32)
Calcium: 8.5 mg/dL — ABNORMAL LOW (ref 8.9–10.3)
Chloride: 108 mmol/L (ref 101–111)
Creatinine, Ser: 0.93 mg/dL (ref 0.61–1.24)
GFR calc Af Amer: >60 mL/min (ref >60)
GFR calc non Af Amer: >60 mL/min (ref >60)
GLUCOSE: 85 mg/dL (ref 65–99)
POTASSIUM: 3.3 mmol/L — AB (ref 3.5–5.1)
Sodium: 139 mmol/L (ref 135–145)
Total Protein: 6.6 g/dL (ref 6.5–8.1)

## 2016-08-30 LAB — CBC
HCT: 42.9 % (ref 39.0–52.0)
HEMOGLOBIN: 14.4 g/dL (ref 13.0–17.0)
MCH: 29.6 pg (ref 26.0–34.0)
MCHC: 33.6 g/dL (ref 30.0–36.0)
MCV: 88.3 fL (ref 78.0–100.0)
Platelets: 139 10*3/uL — ABNORMAL LOW (ref 150–400)
RBC: 4.86 MIL/uL (ref 4.22–5.81)
RDW: 15.1 % (ref 11.5–15.5)
WBC: 7 10*3/uL (ref 4.0–10.5)

## 2016-08-30 LAB — URINALYSIS, ROUTINE W REFLEX MICROSCOPIC
Bilirubin Urine: NEGATIVE
GLUCOSE, UA: NEGATIVE mg/dL
Hgb urine dipstick: NEGATIVE
Ketones, ur: 5 mg/dL — AB
LEUKOCYTES UA: NEGATIVE
Nitrite: NEGATIVE
PH: 6 (ref 5.0–8.0)
Protein, ur: NEGATIVE mg/dL
SPECIFIC GRAVITY, URINE: 1.012 (ref 1.005–1.030)

## 2016-08-30 LAB — CBG MONITORING, ED: Glucose-Capillary: 73 mg/dL (ref 65–99)

## 2016-08-30 LAB — TROPONIN I

## 2016-08-30 LAB — I-STAT TROPONIN, ED: Troponin i, poc: 0.01 ng/mL (ref 0.00–0.08)

## 2016-08-30 LAB — BRAIN NATRIURETIC PEPTIDE: B Natriuretic Peptide: 46.6 pg/mL (ref 0.0–100.0)

## 2016-08-30 MED ORDER — ONDANSETRON HCL 4 MG/2ML IJ SOLN: 4.0000 mg | Freq: Four times a day (QID) | INTRAMUSCULAR | Status: DC | PRN

## 2016-08-30 MED ORDER — LORAZEPAM 1 MG PO TABS: 1.0000 mg | ORAL_TABLET | Freq: Four times a day (QID) | ORAL | Status: DC | PRN

## 2016-08-30 MED ORDER — FOLIC ACID 1 MG PO TABS: 1.0000 mg | ORAL_TABLET | Freq: Every day | ORAL | Status: DC

## 2016-08-30 MED ORDER — ENOXAPARIN SODIUM 40 MG/0.4ML ~~LOC~~ SOLN: 40.0000 mg | SUBCUTANEOUS | Status: DC

## 2016-08-30 MED ORDER — ASPIRIN 325 MG PO TABS: 325.0000 mg | ORAL_TABLET | Freq: Every day | ORAL | Status: DC

## 2016-08-30 MED ORDER — ONDANSETRON HCL 4 MG PO TABS: 4.0000 mg | ORAL_TABLET | Freq: Four times a day (QID) | ORAL | Status: DC | PRN

## 2016-08-30 MED ORDER — ONDANSETRON HCL 4 MG/2ML IJ SOLN: 4.0000 mg | Freq: Three times a day (TID) | INTRAMUSCULAR | Status: DC | PRN

## 2016-08-30 MED ORDER — VITAMIN B-1 100 MG PO TABS: 100.0000 mg | ORAL_TABLET | Freq: Every day | ORAL | Status: DC

## 2016-08-30 MED ORDER — LORAZEPAM 2 MG/ML IJ SOLN
1.0000 mg | Freq: Four times a day (QID) | INTRAMUSCULAR | Status: DC | PRN
Start: 2016-08-30 — End: 2016-08-30

## 2016-08-30 MED ORDER — SODIUM CHLORIDE 0.9% FLUSH: 3.0000 mL | Freq: Two times a day (BID) | INTRAVENOUS | Status: DC

## 2016-08-30 MED ORDER — SODIUM CHLORIDE 0.9 % IV SOLN: 250.0000 mL | INTRAVENOUS | Status: DC | PRN

## 2016-08-30 MED ORDER — ADULT MULTIVITAMIN W/MINERALS CH: 1.0000 | ORAL_TABLET | Freq: Every day | ORAL | Status: DC

## 2016-08-30 MED ORDER — HYDRALAZINE HCL 20 MG/ML IJ SOLN: 5.0000 mg | INTRAMUSCULAR | Status: DC | PRN

## 2016-08-30 MED ORDER — ACETAMINOPHEN 325 MG PO TABS: 650.0000 mg | ORAL_TABLET | Freq: Four times a day (QID) | ORAL | Status: DC | PRN

## 2016-08-30 MED ORDER — SODIUM CHLORIDE 0.9% FLUSH: 3.0000 mL | INTRAVENOUS | Status: DC | PRN

## 2016-08-30 MED ORDER — ACETAMINOPHEN 650 MG RE SUPP: 650.0000 mg | Freq: Four times a day (QID) | RECTAL | Status: DC | PRN

## 2016-08-30 MED ORDER — SODIUM CHLORIDE 0.9 % IV SOLN
INTRAVENOUS | Status: DC
  Administered 2016-08-30: 05:00:00 via INTRAVENOUS

## 2016-08-30 MED ORDER — THIAMINE HCL 100 MG/ML IJ SOLN: 100.0000 mg | Freq: Every day | INTRAMUSCULAR | Status: DC

## 2016-08-30 NOTE — Discharge Summary (Signed)
Physician Discharge Summary  Jesse Bass ZHY:[id number] DOB: 1966-03-30 DOA: 08/30/2016  PCP: No PCP Per Patient  Admit date: 08/30/2016 Discharge date: 08/30/2016  Admitted From: home Disposition:  Home  Left AMA soon after being admitted. Please see H and P from today.      Discharge Diagnoses:  Principal Problem:   Syncope and collapse Active Problems:   OSA (obstructive sleep apnea)   Hypokalemia   Thrombocytopenia (HCC)   HTN (hypertension)   Pedal edema   Obesity, Class III, BMI 40-49.9 (morbid obesity) (HCC)       Allergies as of 08/30/2016   No Known Allergies       No Known Allergies   Procedures/Studies:    Dg Chest 2 View  Result Date: 08/30/2016 CLINICAL DATA:  Acute onset of syncope.  Initial encounter. EXAM: CHEST  2 VIEW COMPARISON:  Chest radiograph performed 02/09/2014 FINDINGS: The lungs are well-aerated and clear. There is no evidence of focal opacification, pleural effusion or pneumothorax. The heart is normal in size; the mediastinal contour is within normal limits. No acute osseous abnormalities are seen. IMPRESSION: No acute cardiopulmonary process seen. Electronically Signed   By: Roanna Raider M.D.   On: 08/30/2016 02:49   Ct Head Wo Contrast  Result Date: 08/30/2016 CLINICAL DATA:  Acute onset of syncope.  Initial encounter. EXAM: CT HEAD WITHOUT CONTRAST TECHNIQUE: Contiguous axial images were obtained from the base of the skull through the vertex without intravenous contrast. COMPARISON:  None. FINDINGS: Brain: No evidence of acute infarction, hemorrhage, hydrocephalus, extra-axial collection or mass lesion/mass effect. The posterior fossa, including the cerebellum, brainstem and fourth ventricle, is within normal limits. The third and lateral ventricles, and basal ganglia are unremarkable in appearance. The cerebral hemispheres are symmetric in appearance, with normal gray-white differentiation. No mass effect or midline shift is seen.  Vascular: No hyperdense vessel or unexpected calcification. Skull: There is no evidence of fracture; visualized osseous structures are unremarkable in appearance. Sinuses/Orbits: The orbits are within normal limits. The paranasal sinuses and mastoid air cells are well-aerated. Other: No significant soft tissue abnormalities are seen. IMPRESSION: Unremarkable noncontrast CT of the head. Electronically Signed   By: Roanna Raider M.D.   On: 08/30/2016 02:49        Discharge Exam: Vitals:   08/30/16 0538 08/30/16 0700  BP: (!) 182/111 (!) 146/92  Pulse: (!) 58 63  Resp: 18 17  Temp:     Vitals:   08/30/16 0330 08/30/16 0430 08/30/16 0538 08/30/16 0700  BP: (!) 157/91 (!) 153/98 (!) 182/111 (!) 146/92  Pulse: 65  (!) 58 63  Resp: Temp:      TempSrc:      SpO2: 95%  99% 97%  Weight:      Height:        General: Pt is alert, awake, not in acute distress Cardiovascular: RRR, S1/S2 +, no rubs, no gallops Respiratory: CTA bilaterally, no wheezing, no rhonchi Abdominal: Soft, NT, ND, bowel sounds + Extremities: no edema, no cyanosis    The results of significant diagnostics from this hospitalization (including imaging, microbiology, ancillary and laboratory) are listed below for reference.     Microbiology: No results found for this or any previous visit (from the past 240 hour(s)).   Labs: BNP (last 3 results)  Recent Labs  08/30/16 0202  BNP 46.6   Basic Metabolic Panel:  Recent Labs Lab 08/30/16 0520  NA 139  K 3.3*  CL 108  CO2 25  GLUCOSE 85  BUN 11  CREATININE 0.93  CALCIUM 8.5*  MG 1.8   Liver Function Tests:  Recent Labs Lab 08/30/16 0520  AST 22  ALT 18  ALKPHOS 61  BILITOT 0.9  PROT 6.6  ALBUMIN 3.4*   No results for input(s): LIPASE, AMYLASE in the last 168 hours. No results for input(s): AMMONIA in the last 168 hours. CBC:  Recent Labs Lab 08/30/16 0201  WBC 7.0  HGB 14.4  HCT 42.9  MCV 88.3  PLT 139*   Cardiac  Enzymes:  Recent Labs Lab 08/30/16 0520  TROPONINI <0.03   BNP: Invalid input(s): POCBNP CBG:  Recent Labs Lab 08/30/16 0110  GLUCAP 73   D-Dimer No results for input(s): DDIMER in the last 72 hours. Hgb A1c No results for input(s): HGBA1C in the last 72 hours. Lipid Profile No results for input(s): CHOL, HDL, LDLCALC, TRIG, CHOLHDL, LDLDIRECT in the last 72 hours. Thyroid function studies No results for input(s): TSH, T4TOTAL, T3FREE, THYROIDAB in the last 72 hours.  Invalid input(s): FREET3 Anemia work up No results for input(s): VITAMINB12, FOLATE, FERRITIN, TIBC, IRON, RETICCTPCT in the last 72 hours. Urinalysis    Component Value Date/Time   COLORURINE YELLOW 08/30/2016 0543   APPEARANCEUR CLEAR 08/30/2016 0543   LABSPEC 1.012 08/30/2016 0543   PHURINE 6.0 08/30/2016 0543   GLUCOSEU NEGATIVE 08/30/2016 0543   HGBUR NEGATIVE 08/30/2016 0543   BILIRUBINUR NEGATIVE 08/30/2016 0543   KETONESUR 5 (A) 08/30/2016 0543   PROTEINUR NEGATIVE 08/30/2016 0543   NITRITE NEGATIVE 08/30/2016 0543   LEUKOCYTESUR NEGATIVE 08/30/2016 0543   Sepsis Labs Invalid input(s): PROCALCITONIN,  WBC,  LACTICIDVEN Microbiology No results found for this or any previous visit (from the past 240 hour(s)).   Time coordinating discharge: Over 30 minutes  SIGNED:   Calvert Cantor, MD  [facility] 08/30/2016, 11:23 AM Pager   If 7PM-7AM, please contact night-coverage [email or web address] Password [email or web address]

## 2016-08-30 NOTE — Consult Note (Addendum)
Admit date: 08/30/2016 Referring Physician  Dr. Lorretta Harp Primary Physician  None Primary Cardiologist  None Reason for Consultation  syncope  HPI: Jesse Bass is a 51 y.o. male who is being seen today for the evaluation of syncope at the request of Dr. Lorretta Harp, MD.  This is a 50yo morbidly obese AAM with a history of OSA on CPAP, ETOH abuse, tobacco abuse and Marijuana use who was at work and went outside to smoke a cigarette real fast and had a syncopal episode.  He says that he felt fine prior to the event and denied any CP, SOB, palpitations, dizziness, diaphoresis or nausea and the next thing he knows he is on the ground with people staring at him.  He had no evidence of tongue biting and did not injure himself.  There was no fecal or urinary incontinence.  In ER orthostatic were negative and Head CT was unremarkable.  He says that he has been very stressed recently.  His dad is dying of heart disease, he is going through a bad divorce, working 2 jobs and has to stand on his feet a lot.  He has had problems with LE edema recently when he stands for long periods of time.  He denies any exertional CP or SOB. On exam his BP was elevated at 157/57mmHg.   Trop negative x 1 and EKG with ? Slight ST elevation read by computer.    PMH: OSA on CPAP, morbid obesity, LE edema  PSH:   Past Surgical History:  Procedure Laterality Date  . KNEE SURGERY  2000   Left    Allergies:  Patient has no known allergies. Prior to Admit Meds:   (Not in a hospital admission) Fam HX:    Family History  Problem Relation Age of Onset  . Hypertension Mother   . Heart disease Father   . Hypertension Father   . Hyperlipidemia Father    Social HX:    Social History   Social History  . Marital status: Single    Spouse name: [place]  . Number of children: Y  . Years of education: N/A   Occupational History  . unemployed    Social History Main Topics  . Smoking status: Current Some Day Smoker   Packs/day: 0.50    Years: 27.00    Types: Cigars  . Smokeless tobacco: Never Used     Comment: Smokes black &milds 1-2/week  . Alcohol use No  . Drug use: Unknown  . Sexual activity: Not on file   Other Topics Concern  . Not on file   Social History Narrative  . No narrative on file     ROS:  All  ROS were addressed and are negative except what is stated in the HPI  Physical Exam: Blood pressure (!) 146/92, pulse 63, temperature 97.9 F (36.6 C), temperature source Oral, resp. rate 17, height  (1.803 m), weight 296 lb (134.3 kg), SpO2 97 %.    General: Well developed, well nourished, morbidly obese, in no acute distress Head: Eyes PERRLA, No xanthomas.   Normal cephalic and atramatic  Lungs:   Clear bilaterally to auscultation and percussion. Heart:   HRRR S1 S2 Pulses are 2+ & equal.            No carotid bruit. No JVD.  No abdominal bruits. No femoral bruits. Abdomen: Bowel sounds are positive, abdomen soft and non-tender without masses or  Hernia's noted. Msk:  Back normal, normal gait. Normal strength and tone for age. Extremities:   No clubbing, cyanosis or edema.  DP +1 Neuro: Alert and oriented X 3. Psych:  Good affect, responds appropriately    Labs:   Lab Results  Component Value Date   WBC 7.0 08/30/2016   HGB 14.4 08/30/2016   HCT 42.9 08/30/2016   MCV 88.3 08/30/2016   PLT 139 (L) 08/30/2016     Recent Labs Lab 08/30/16 0520  NA 139  K 3.3*  CL 108  CO2 25  BUN 11  CREATININE 0.93  CALCIUM 8.5*  PROT 6.6  BILITOT 0.9  ALKPHOS 61  ALT 18  AST 22  GLUCOSE 85   No results found for: PTT No results found for: INR, PROTIME Lab Results  Component Value Date   TROPONINI <0.03 08/30/2016    No results found for: CHOL No results found for: HDL No results found for: LDLCALC No results found for: TRIG No results found for: CHOLHDL No results found for: LDLDIRECT    Radiology:  Dg Chest 2 View  Result Date:  08/30/2016 CLINICAL DATA:  Acute onset of syncope.  Initial encounter. EXAM: CHEST  2 VIEW COMPARISON:  Chest radiograph performed 02/09/2014 FINDINGS: The lungs are well-aerated and clear. There is no evidence of focal opacification, pleural effusion or pneumothorax. The heart is normal in size; the mediastinal contour is within normal limits. No acute osseous abnormalities are seen. IMPRESSION: No acute cardiopulmonary process seen. Electronically Signed   By: Roanna Raider M.D.   On: 08/30/2016 02:49   Ct Head Wo Contrast  Result Date: 08/30/2016 CLINICAL DATA:  Acute onset of syncope.  Initial encounter. EXAM: CT HEAD WITHOUT CONTRAST TECHNIQUE: Contiguous axial images were obtained from the base of the skull through the vertex without intravenous contrast. COMPARISON:  None. FINDINGS: Brain: No evidence of acute infarction, hemorrhage, hydrocephalus, extra-axial collection or mass lesion/mass effect. The posterior fossa, including the cerebellum, brainstem and fourth ventricle, is within normal limits. The third and lateral ventricles, and basal ganglia are unremarkable in appearance. The cerebral hemispheres are symmetric in appearance, with normal gray-white differentiation. No mass effect or midline shift is seen. Vascular: No hyperdense vessel or unexpected calcification. Skull: There is no evidence of fracture; visualized osseous structures are unremarkable in appearance. Sinuses/Orbits: The orbits are within normal limits. The paranasal sinuses and mastoid air cells are well-aerated. Other: No significant soft tissue abnormalities are seen. IMPRESSION: Unremarkable noncontrast CT of the head. Electronically Signed   By: Roanna Raider M.D.   On: 08/30/2016 02:49     Telemetry    NSR - Personally Reviewed  ECG    NSR with nonspecific ST abnormality and new T wave inversions in V5 and V6- Personally Reviewed   ASSESSMENT/PLAN:   1.  Syncope ? Etiology.  There was no prodrome prior to the  event. No seizure activity noted.  EKG with only new nonspecific T wave abnormality in the lateral leads which may be strain due to HTN.  He has not had any CP or SOB.  He was standing at the time so could have been orthostatic but orthostatic BP in ER were normal.  Trop negative x 1.  Recommend 2D echo to assess LVF.  If normal then would recommend outpt event monitor.  He does have CRFs including tobacco use, family history of CAD, obesity and HTN.  He says that his lipids have been good.    2.  HTN -  BP poorly controlled.  Would recommend starting antihypertensive therapy as he has multiple CRFs.  Will leave med decision up to [facility].    3.  LE edema - likely related to venous stasis from standing at work.  He has no edema on exam today.  May benefit from adding a thiazide diuretic.     Armanda Magic, MD  08/30/2016  12:30 PM

## 2016-08-30 NOTE — H&P (Signed)
History and Physical    Jesse Bass WUJ:[id number] DOB: 09-19-1965 DOA: 08/30/2016    PCP: No PCP Per Patient  Patient coming from: home  Chief Complaint: passed out  HPI: Jesse Bass is a 51 y.o. male with medical history of OSA who wears a CPAP, alcohol abuse, smoker and Marijuana abuse who went out for a smoke while working and passed out. He does not re-call passing out and when awake, remembers his co-workers surrounding him. He did not have any chest pain, palpitations, numbness or tingling, focal weakness, headache, visual changes. He did not bite his tongue or lose control of bladder or bowel.   ED Course:  Orthostatic vitals negative Head CT negative  Review of Systems:  Ankle were very swollen mainly due to working 2 jobs and having to be on his feet all day at one of those jobs. He quit the job due to ankle edema and the edema has improved.  All other systems reviewed and apart from HPI, are negative.  History reviewed. No pertinent past medical history.  Past Surgical History:  Procedure Laterality Date  . KNEE SURGERY  2000   Left    Social History:  Reports that he has been smoking Cigarettes. 1 pack lasts about 2-3 days. He has never used smokeless tobacco.  He drinks "too much". Have asked him repeatedly and he thinks he drinks about 1/2 a bottle of liquor a day.  He smokes Marijauna but uses no other drugs.   No Known Allergies  No family history on file.   Prior to Admission medications   Medication Sig Start Date End Date Taking? Authorizing Provider  cyclobenzaprine (FLEXERIL) 10 MG tablet Take 1 tablet (10 mg total) by mouth 2 (two) times daily as needed for muscle spasms. Patient not taking: Reported on 08/30/2016 05/21/16   Elson Areas, PA-C  diclofenac (VOLTAREN) 75 MG EC tablet Take 1 tablet (75 mg total) by mouth 2 (two) times daily. Patient not taking: Reported on 08/30/2016 05/21/16   Elson Areas, PA-C  methocarbamol (ROBAXIN) 500 MG  tablet Take 1 tablet (500 mg total) by mouth at bedtime and may repeat dose one time if needed. Patient not taking: Reported on 08/30/2016 05/16/16   Bethel Born, PA-C  omeprazole (PRILOSEC) 20 MG capsule Take 1 capsule (20 mg total) by mouth daily. Patient not taking: Reported on 08/30/2016 02/09/14   Arby Barrette, MD    Physical Exam: Wt Readings from Last 3 Encounters:  08/29/16 134.3 kg (296 lb)  02/09/14 131.5 kg (290 lb)  03/17/13 (!) 156.9 kg (346 lb)   Vitals:   08/30/16 0330 08/30/16 0430 08/30/16 0538 08/30/16 0700  BP: (!) 157/91 (!) 153/98 (!) 182/111 (!) 146/92  Pulse: 65  (!) 58 63  Resp: Temp:      TempSrc:      SpO2: 95%  99% 97%  Weight:      Height:          Constitutional: NAD, calm, comfortable Eyes: PERTLA, lids and conjunctivae normal ENMT: Mucous membranes are moist. Posterior pharynx clear of any exudate or lesions. Normal dentition.  Neck: normal, supple, no masses, no thyromegaly Respiratory: clear to auscultation bilaterally, no wheezing, no crackles. Normal respiratory effort. No accessory muscle use.  Cardiovascular: S1 & S2 heard, regular rate and rhythm, no murmurs / rubs / gallops. No extremity edema. 2+ pedal pulses. No carotid bruits.  Abdomen: No distension, no tenderness, no masses palpated. No  hepatosplenomegaly. Bowel sounds normal.  Musculoskeletal: no clubbing / cyanosis. No joint deformity upper and lower extremities. Good ROM, no contractures. Normal muscle tone.  Skin: no rashes, lesions, ulcers. No induration Neurologic: CN 2-12 grossly intact. Sensation intact, DTR normal. Strength 5/5 in all 4 limbs.  Psychiatric: Normal judgment and insight. Alert and oriented x 3. Normal mood.     Labs on Admission: I have personally reviewed following labs and imaging studies  CBC:  Recent Labs Lab 08/30/16 0201  WBC 7.0  HGB 14.4  HCT 42.9  MCV 88.3  PLT 139*   Basic Metabolic Panel:  Recent Labs Lab  08/30/16 0520  NA 139  K 3.3*  CL 108  CO2 25  GLUCOSE 85  BUN 11  CREATININE 0.93  CALCIUM 8.5*   GFR: Estimated Creatinine Clearance: 131.5 mL/min (by C-G formula based on SCr of 0.93 mg/dL). Liver Function Tests:  Recent Labs Lab 08/30/16 0520  AST 22  ALT 18  ALKPHOS 61  BILITOT 0.9  PROT 6.6  ALBUMIN 3.4*   No results for input(s): LIPASE, AMYLASE in the last 168 hours. No results for input(s): AMMONIA in the last 168 hours. Coagulation Profile: No results for input(s): INR, PROTIME in the last 168 hours. Cardiac Enzymes:  Recent Labs Lab 08/30/16 0520  TROPONINI <0.03   BNP (last 3 results) No results for input(s): PROBNP in the last 8760 hours. HbA1C: No results for input(s): HGBA1C in the last 72 hours. CBG:  Recent Labs Lab 08/30/16 0110  GLUCAP 73   Lipid Profile: No results for input(s): CHOL, HDL, LDLCALC, TRIG, CHOLHDL, LDLDIRECT in the last 72 hours. Thyroid Function Tests: No results for input(s): TSH, T4TOTAL, FREET4, T3FREE, THYROIDAB in the last 72 hours. Anemia Panel: No results for input(s): VITAMINB12, FOLATE, FERRITIN, TIBC, IRON, RETICCTPCT in the last 72 hours. Urine analysis:    Component Value Date/Time   COLORURINE YELLOW 08/30/2016 0543   APPEARANCEUR CLEAR 08/30/2016 0543   LABSPEC 1.012 08/30/2016 0543   PHURINE 6.0 08/30/2016 0543   GLUCOSEU NEGATIVE 08/30/2016 0543   HGBUR NEGATIVE 08/30/2016 0543   BILIRUBINUR NEGATIVE 08/30/2016 0543   KETONESUR 5 (A) 08/30/2016 0543   PROTEINUR NEGATIVE 08/30/2016 0543   NITRITE NEGATIVE 08/30/2016 0543   LEUKOCYTESUR NEGATIVE 08/30/2016 0543   Sepsis Labs: (procalcitonin:4,lacticidven:4) )No results found for this or any previous visit (from the past 240 hour(s)).   Radiological Exams on Admission: Dg Chest 2 View  Result Date: 08/30/2016 CLINICAL DATA:  Acute onset of syncope.  Initial encounter. EXAM: CHEST  2 VIEW COMPARISON:  Chest radiograph performed  02/09/2014 FINDINGS: The lungs are well-aerated and clear. There is no evidence of focal opacification, pleural effusion or pneumothorax. The heart is normal in size; the mediastinal contour is within normal limits. No acute osseous abnormalities are seen. IMPRESSION: No acute cardiopulmonary process seen. Electronically Signed   By: Roanna Raider M.D.   On: 08/30/2016 02:49   Ct Head Wo Contrast  Result Date: 08/30/2016 CLINICAL DATA:  Acute onset of syncope.  Initial encounter. EXAM: CT HEAD WITHOUT CONTRAST TECHNIQUE: Contiguous axial images were obtained from the base of the skull through the vertex without intravenous contrast. COMPARISON:  None. FINDINGS: Brain: No evidence of acute infarction, hemorrhage, hydrocephalus, extra-axial collection or mass lesion/mass effect. The posterior fossa, including the cerebellum, brainstem and fourth ventricle, is within normal limits. The third and lateral ventricles, and basal ganglia are unremarkable in appearance. The cerebral hemispheres are symmetric in appearance, with normal gray-white  differentiation. No mass effect or midline shift is seen. Vascular: No hyperdense vessel or unexpected calcification. Skull: There is no evidence of fracture; visualized osseous structures are unremarkable in appearance. Sinuses/Orbits: The orbits are within normal limits. The paranasal sinuses and mastoid air cells are well-aerated. Other: No significant soft tissue abnormalities are seen. IMPRESSION: Unremarkable noncontrast CT of the head. Electronically Signed   By: Roanna Raider M.D.   On: 08/30/2016 02:49    EKG: Independently reviewed. Sinus rhythm, T wave inversions in leads 3, V5, V 6  Assessment/Plan Principal Problem:   Syncope and collapse - cardiac vs ETOH withdrawal seizure - abnormal eKG- f/u ECHO and follow on telemetry - cardiology consult requested  Active Problems:   OSA (obstructive sleep apnea) - uses CPAP- will order  Hypokalemia - check  Mg- replace    Thrombocytopenia  - acute- ? Due to ETOH abuse- follow  Smoker - does not need nicotine patch  ETOH abuse - CIWA scale with Aitvan     HTN (hypertension) - does not take meds - PRN Hydralazine, dietary consult for low sodium diet    Pedal edema - ? Right heart failure vs venous stasis - improved since he quit the job were he was standing on his feet all day - ECHO pending    Morbid Obesity Body mass index is 41.28 kg/m.  ADDENDEM: patient decided to leave AMA  DVT prophylaxis: Lovenox Code Status: Full code  Family Communication:  Disposition Plan: telemetry  Consults called: cardiology  Admission status: observation    Calvert Cantor MD [facility] Pager: [email or web address] Password [email or web address] 7PM-7AM, please contact night-coverage   08/30/2016, 7:54 AM

## 2016-08-30 NOTE — ED Notes (Signed)
Pt urinated immediately when he got into his room and did not catch him in time

## 2016-08-30 NOTE — ED Provider Notes (Signed)
[facility]-EMERGENCY DEPT Provider Note   CSN: [id number] Arrival date & time: 08/29/16  2155  By signing my name below, I, Karren Cobble, attest that this documentation has been prepared under the direction and in the presence of [facility], PA-C.  Electronically Signed: Karren Cobble, ED Scribe. 08/30/16. 2:06 AM.  History   Chief Complaint Chief Complaint  Patient presents with  . Loss of Consciousness   HPI The history is provided by the patient. No language interpreter was used.    HPI Comments: Jesse Bass is a 51 y.o. male with a PMHx of OSA who presents to the Emergency Department following a sudden episode of loss of consciousness around 9:00 pm tonight with no prodromal features. Pt states he was standing outside smoking a cigarette when he lost consciousness; he does not recall the amount of time he was unconscious but notes upon awaking he was surrounded by his coworkers. Does not believe anyone witnessed event.Does not believe he had serious head injury, but not sure. He notes he had a general headache after this episode, but this resolved following a short nap. He has noticed moderate bilateral swelling to his lower extremities with an onset around two weeks ago. No known prior cardiac issues/events. Pt notes he using THC regularly as well as consuming EtOH on a daily basis. He denies any other illicit drug usage. He denies visual disturbance, abdominal pain, difficulty urinating, dysuria, back pain, shortness of breath, or chest pain.   History reviewed. No pertinent past medical history.  Patient Active Problem List   Diagnosis Date Noted  . Syncope 08/30/2016  . OSA (obstructive sleep apnea) 02/11/2011   Past Surgical History:  Procedure Laterality Date  . KNEE SURGERY  2000   Left    Home Medications    Prior to Admission medications   Medication Sig Start Date End Date Taking? Authorizing Provider  cyclobenzaprine (FLEXERIL) 10 MG tablet Take 1 tablet (10 mg total) by  mouth 2 (two) times daily as needed for muscle spasms. Patient not taking: Reported on 08/30/2016 05/21/16   Elson Areas, PA-C  diclofenac (VOLTAREN) 75 MG EC tablet Take 1 tablet (75 mg total) by mouth 2 (two) times daily. Patient not taking: Reported on 08/30/2016 05/21/16   Elson Areas, PA-C  methocarbamol (ROBAXIN) 500 MG tablet Take 1 tablet (500 mg total) by mouth at bedtime and may repeat dose one time if needed. Patient not taking: Reported on 08/30/2016 05/16/16   Bethel Born, PA-C  omeprazole (PRILOSEC) 20 MG capsule Take 1 capsule (20 mg total) by mouth daily. Patient not taking: Reported on 08/30/2016 02/09/14   Arby Barrette, MD   Family History No family history on file.  Social History Social History  Substance Use Topics  . Smoking status: Current Some Day Smoker    Packs/day: 0.50    Years: 27.00    Types: Cigars  . Smokeless tobacco: Never Used     Comment: Smokes black &milds 1-2/week  . Alcohol use No   Allergies   Patient has no known allergies.  Review of Systems Review of Systems  Eyes: Negative for visual disturbance.  Respiratory: Negative for shortness of breath.   Cardiovascular: Positive for leg swelling and syncope. Negative for chest pain.  Gastrointestinal: Negative for abdominal pain.  Genitourinary: Negative for difficulty urinating and dysuria.  Musculoskeletal: Negative for back pain.  Neurological: Positive for syncope and headaches.  All other systems reviewed and are negative.  Physical Exam Updated Vital Signs  BP (!) 161/113   Pulse 68   Temp 97.9 F (36.6 C) (Oral)   Resp (!) 24   Ht  (1.803 m)   Wt 134.3 kg   SpO2 97%   BMI 41.28 kg/m   Physical Exam  Constitutional: He is oriented to person, place, and time. He appears well-developed and well-nourished. No distress.  HENT:  Head: Normocephalic and atraumatic.  Neck: Normal range of motion. Neck supple. No JVD present.  Cardiovascular: Normal rate, regular  rhythm and normal heart sounds.   No murmur heard. Pulmonary/Chest: Effort normal and breath sounds normal. No respiratory distress. He has no wheezes. He has no rales.  Abdominal: Soft. He exhibits no distension. There is no tenderness.  Musculoskeletal: He exhibits edema.  Neurological: He is alert and oriented to person, place, and time.  Skin: Skin is warm and dry.  Nursing note and vitals reviewed.  ED Treatments / Results   DIAGNOSTIC STUDIES: Oxygen Saturation is 100% on RA, normal by my interpretation.   COORDINATION OF CARE: 1:43 AM-Discussed next steps with pt. Pt verbalized understanding and is agreeable with the plan.   Labs (all labs ordered are listed, but only abnormal results are displayed) Labs Reviewed  CBC - Abnormal; Notable for the following:       Result Value   Platelets 139 (*)    All other components within normal limits  BRAIN NATRIURETIC PEPTIDE  URINALYSIS, ROUTINE W REFLEX MICROSCOPIC  COMPREHENSIVE METABOLIC PANEL  TROPONIN I  TROPONIN I  TROPONIN I  CBG MONITORING, ED  I-STAT TROPOININ, ED   EKG  EKG Interpretation  Date/Time:  Saturday August 29 2016 22:15:32 EDT Ventricular Rate:  72 PR Interval:    QRS Duration: 94 QT Interval:  361 QTC Calculation: 395 R Axis:   -13 Text Interpretation:  Sinus rhythm Left atrial enlargement Minimal ST elevation, anterior leads new TWI inferior leads, new STD in V5-V6 Reconfirmed by Erroll Luna [phone]) on 08/30/2016 3:08:05 AM      Radiology Dg Chest 2 View  Result Date: 08/30/2016 CLINICAL DATA:  Acute onset of syncope.  Initial encounter. EXAM: CHEST  2 VIEW COMPARISON:  Chest radiograph performed 02/09/2014 FINDINGS: The lungs are well-aerated and clear. There is no evidence of focal opacification, pleural effusion or pneumothorax. The heart is normal in size; the mediastinal contour is within normal limits. No acute osseous abnormalities are seen. IMPRESSION: No acute cardiopulmonary  process seen. Electronically Signed   By: Roanna Raider M.D.   On: 08/30/2016 02:49   Ct Head Wo Contrast  Result Date: 08/30/2016 CLINICAL DATA:  Acute onset of syncope.  Initial encounter. EXAM: CT HEAD WITHOUT CONTRAST TECHNIQUE: Contiguous axial images were obtained from the base of the skull through the vertex without intravenous contrast. COMPARISON:  None. FINDINGS: Brain: No evidence of acute infarction, hemorrhage, hydrocephalus, extra-axial collection or mass lesion/mass effect. The posterior fossa, including the cerebellum, brainstem and fourth ventricle, is within normal limits. The third and lateral ventricles, and basal ganglia are unremarkable in appearance. The cerebral hemispheres are symmetric in appearance, with normal gray-white differentiation. No mass effect or midline shift is seen. Vascular: No hyperdense vessel or unexpected calcification. Skull: There is no evidence of fracture; visualized osseous structures are unremarkable in appearance. Sinuses/Orbits: The orbits are within normal limits. The paranasal sinuses and mastoid air cells are well-aerated. Other: No significant soft tissue abnormalities are seen. IMPRESSION: Unremarkable noncontrast CT of the head. Electronically Signed   By: Roanna Raider  M.D.   On: 08/30/2016 02:49    Procedures Procedures   Medications Ordered in ED Medications  aspirin tablet 325 mg (not administered)  0.9 %  sodium chloride infusion (not administered)  ondansetron (ZOFRAN) injection 4 mg (not administered)  acetaminophen (TYLENOL) tablet 650 mg (not administered)  hydrALAZINE (APRESOLINE) injection 5 mg (not administered)    Initial Impression / Assessment and Plan / ED Course  I have reviewed the triage vital signs and the nursing notes.  Pertinent labs & imaging results that were available during my care of the patient were reviewed by me and considered in my medical decision making (see chart for details).    Jesse Bass is a  51 y.o. male who presents to ED for evaluation after syncopal event tonight with no prodromal features. Episode was not witnessed. No incontinence or evidence of tongue biting. Does not seem like there was a post-ictal phase. Doubt seizure. EKG reviewed and does show new t wave inversion in inferior leads and st depression in v5v6. Troponin negative and no chest pain / shortness of breath at this time. Given history and new EKG changes, hospitalist was consulted who will admit. Hospitalist, Dr. Clyde Lundborg, asked for me to put in MR brain without contrast which has been done. Patient endorses daily ETOH use, therefore CIWA protocol in place.   Patient discussed with Dr. Mora Bellman who agrees with treatment plan.    Final Clinical Impressions(s) / ED Diagnoses   Final diagnoses:  Syncope   New Prescriptions New Prescriptions   No medications on file     [facility], PA-C 08/30/16 [id number]    Tomasita Crumble, MD 08/30/16 [phone]

## 2016-08-30 NOTE — Progress Notes (Signed)
This is a no charge note   Pending admission per PA Marijean Niemann  51 year old male with past medical history for GERD, tobacco abuse, who presents with syncope episode. ED physician, patient had syncope episode when he was smoking. No prodromal symptoms. Denies any chest pain or shortness breath. No GI symptoms or symptoms of UTI. No fever or leukocytosis. There is some ischemic EKG change. Pt is accepted to tele bed for obs. Will order MRI of brain, troponin 3, 2-D echo, EEG, orthostatic vital sign. Start IV fluid 100 mL/h. Start ASA.   Lorretta Harp, MD  [facility] Pager [phone]  If 7PM-7AM, please contact night-coverage [email or web address] Password [facility] 08/30/2016, 3:42 AM

## 2016-09-04 ENCOUNTER — Encounter: Payer: Self-pay | Admitting: Physician Assistant

## 2016-09-08 ENCOUNTER — Ambulatory Visit: Payer: Self-pay | Admitting: Physician Assistant

## 2016-09-17 ENCOUNTER — Encounter: Payer: Self-pay | Admitting: Physician Assistant
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
## 2020-10-25 ENCOUNTER — Ambulatory Visit: Payer: Medicaid Other | Admitting: Rehabilitative and Restorative Service Providers"

## 2021-01-24 ENCOUNTER — Ambulatory Visit: Payer: Medicaid Other | Admitting: Physical Therapy

## 2021-02-03 ENCOUNTER — Ambulatory Visit: Payer: Medicaid Other | Admitting: Rehabilitative and Restorative Service Providers"

## 2021-02-12 ENCOUNTER — Ambulatory Visit: Payer: Medicaid Other | Attending: Physician Assistant | Admitting: Rehabilitative and Restorative Service Providers"

## 2021-02-12 ENCOUNTER — Other Ambulatory Visit: Payer: Self-pay

## 2021-02-12 ENCOUNTER — Encounter: Payer: Self-pay | Admitting: Rehabilitative and Restorative Service Providers"

## 2021-02-12 DIAGNOSIS — G8929 Other chronic pain: Secondary | ICD-10-CM | POA: Insufficient documentation

## 2021-02-12 DIAGNOSIS — R262 Difficulty in walking, not elsewhere classified: Secondary | ICD-10-CM | POA: Diagnosis present

## 2021-02-12 DIAGNOSIS — M545 Low back pain, unspecified: Secondary | ICD-10-CM | POA: Insufficient documentation

## 2021-02-12 DIAGNOSIS — R600 Localized edema: Secondary | ICD-10-CM | POA: Diagnosis present

## 2021-02-12 DIAGNOSIS — R252 Cramp and spasm: Secondary | ICD-10-CM | POA: Diagnosis present

## 2021-02-12 DIAGNOSIS — M6281 Muscle weakness (generalized): Secondary | ICD-10-CM | POA: Insufficient documentation

## 2021-02-12 NOTE — Therapy (Signed)
[facility] [address] [place], [place], [address] Phone: [phone]   Fax:  [phone]  Physical Therapy Evaluation  Patient Details  Name: Jesse Bass MRN: [id number] Date of Birth: 1965/07/22 Referring Provider (PT): Sherrie Mustache, PA-C   Encounter Date: 02/12/2021   PT End of Session - 02/12/21 0947     Visit Number 1    Date for PT Re-Evaluation 04/04/21    Authorization Type [employer] Medicaid    Authorization - Visit Number 1    Authorization - Number of Visits 27    PT Start Time 0900    PT Stop Time 0930    PT Time Calculation (min) 30 min    Activity Tolerance Patient limited by fatigue    Behavior During Therapy [facility] for tasks assessed/performed             Past Medical History:  Diagnosis Date   Morbid obesity (HCC)    OSA on CPAP    Polysubstance abuse (HCC)     Past Surgical History:  Procedure Laterality Date   KNEE SURGERY  2000   Left    There were no vitals filed for this visit.    Subjective Assessment - 02/12/21 0904     Subjective Pt reports that he has not really walked more than a few steps in the past couple of years.  Pt reports that he was living in his [address] for approx 8 years.  Has been in current apartment since August. He states that in August, he fell on the floor and had to contact EMS to assist him with with getting back up in chair.  He states that he would like to get stronger so that he can do more.    Pertinent History Obesity, Spinal Stenosis, spinal DJD    Limitations Standing;Walking    How long can you stand comfortably? approx 2 min    How long can you walk comfortably? approx 5 steps    Patient Stated Goals I want to get back on my feet.    Currently in Pain? Yes    Pain Score 9     Pain Location Back    Pain Orientation Lower    Pain Descriptors / Indicators Aching;Sharp    Pain Type Chronic pain    Pain Onset More than a month ago    Pain Frequency Constant                 [facility] PT Assessment - 02/12/21 0001       Assessment   Medical Diagnosis Obesity, Low Back Pain    Referring Provider (PT) Sherrie Mustache, PA-C    Hand Dominance Right    Next MD Visit 02/19/2021      Precautions   Precautions Fall      Restrictions   Weight Bearing Restrictions No      Balance Screen   Has the patient fallen in the past 6 months Yes    How many times? 1    Has the patient had a decrease in activity level because of a fear of falling?  Yes    Is the patient reluctant to leave their home because of a fear of falling?  Yes      Home Environment   Living Environment Private residence    Living Arrangements Alone    Type of Home Apartment   First Floor   Home Access Level entry    Home Layout One level  Prior Function   Level of Independence Independent with basic ADLs    Vocation On disability    Leisure reading, watching tv      Observation/Other Assessments-Edema    Edema --   Edema noted LLE greater than RLE     ROM / Strength   AROM / PROM / Strength AROM;Strength      AROM   Overall AROM Comments shoulder ROM is [facility]      Strength   Overall Strength Comments B shoulder strength 4-/5.  BLE strength of 3-/5      Transfers   Comments 30 sec chair stand:  1   1 sit to/from stand in 24 sec                       Objective measurements completed on examination: See above findings.                PT Education - 02/12/21 0924     Education Details Pt provided with HEP    Person(s) Educated Patient    Methods Explanation;Demonstration;Handout    Comprehension Verbalized understanding;Returned demonstration              PT Short Term Goals - 02/12/21 1043       PT SHORT TERM GOAL #1   Title Pt will be independent with initial HEP.    Time 2    Period Weeks    Status New               PT Long Term Goals - 02/12/21 1044       PT LONG TERM GOAL #1   Title Pt will be  independent with advanced HEP.    Time 8    Period Weeks    Status New      PT LONG TERM GOAL #2   Title Pt will increase BUE stength to at least 4/5 to allow him to more easily complete bathing and self care in home.    Time 8    Period Weeks      PT LONG TERM GOAL #3   Title Pt will increase BLE strength to at least 4/5 to allow him to be able to ambulate with LRAD to have limited community ambulation.    Time 8    Period Weeks      PT LONG TERM GOAL #4   Title Pt will be able to perform 5 times sit to/from stand in less than 30 seconds to allow for improved functional mobility.    Time 8    Period Weeks    Status New      PT LONG TERM GOAL #5   Title Pt will be able to ambulate greater than 200 ft with LRAD to allow him limited community ambulation.    Time 8    Period Weeks    Status New                    Plan - 02/12/21 0949     Clinical Impression Statement Pt is a 55 y.o. male referred to outpatient PT by Sherrie Mustache, PA-C secondary to a diagnosis of LE weakness and obesity with a recent fall in August 2022 where pt was unable to get up from the floor without calling EMS.  Pt admits that some of his pain started when he was living in his [address] for 8 years secondary to limited movement allowed.  He has not weight  recently, but his last known weight was over 400 pounds.  He typically uses a 4WRW, but reports that a wheel has broken off it, so he will need a new one.  Prior to the [employer], pt had just gotten a job at [employer], but he was laid off due to shutdowns and has not had a job since.  He was able to move into a studio apartment in August and states this has helped him since he has more room to get up and move around and stretch. Pt presents with UE/LE muscle weakness, decreased independence with transfers, difficulty walking, and decreased independence with functional mobility.    Personal Factors and Comorbidities Fitness;Finances;Social  Background;Comorbidity 3+    Comorbidities obesity, spinal stenosis, spinal DJD    Examination-Activity Limitations Bathing;Locomotion Level;Transfers;Bed Mobility;Stand    Examination-Participation Restrictions Meal Prep;Community Activity;Driving;Shop    Stability/Clinical Decision Making Evolving/Moderate complexity    Clinical Decision Making Moderate    Rehab Potential Fair    PT Frequency Other (comment)   1-2x/ week   PT Duration 8 weeks    PT Treatment/Interventions ADLs/Self Care Home Management;Cryotherapy;Electrical Stimulation;Iontophoresis 4mg /ml Dexamethasone;Moist Heat;Gait training;Stair training;Functional mobility training;Therapeutic activities;Therapeutic exercise;Balance training;Neuromuscular re-education;Patient/family education;Wheelchair mobility training;Manual techniques;Passive range of motion;Dry needling;Taping    PT Next Visit Plan assess and progress HEP, tranfsers, standing, ambulation, strengthening    PT Home Exercise Plan Access Code:    Recommended Other Services Pt will need a new 4WRW.  If outpatient becomes too much and pt unable to safely make, he may need home health PT.    Consulted and Agree with Plan of Care Patient             Patient will benefit from skilled therapeutic intervention in order to improve the following deficits and impairments:  Difficulty walking, Decreased endurance, Obesity, Decreased activity tolerance, Pain, Decreased balance, Impaired flexibility, Decreased strength  Visit Diagnosis: Chronic low back pain, unspecified back pain laterality, unspecified whether sciatica present - Plan: PT plan of care cert/re-cert  Difficulty in walking, not elsewhere classified - Plan: PT plan of care cert/re-cert  Muscle weakness (generalized) - Plan: PT plan of care cert/re-cert  Cramp and spasm - Plan: PT plan of care cert/re-cert  Localized edema - Plan: PT plan of care cert/re-cert     Problem List Patient Active  Problem List   Diagnosis Date Noted   Hypokalemia 08/30/2016   Thrombocytopenia (HCC) 08/30/2016   Syncope and collapse 08/30/2016   HTN (hypertension) 08/30/2016   Pedal edema 08/30/2016   Obesity, Class III, BMI 40-49.9 (morbid obesity) (HCC) 08/30/2016   Polysubstance abuse (HCC)    OSA (obstructive sleep apnea) 02/11/2011    04/13/2011, PT, DPT 02/12/2021, 10:59 AM  [facility] [address] [place], [place], [place] Phone: [phone]   Fax:  [phone]  Name: Mykel Sponaugle MRN: Nona Dell Date of Birth: Sep 24, 1965

## 2021-02-12 NOTE — Patient Instructions (Signed)
Access Code: [email or web address] URL: [email or web address] Date: 02/12/2021 Prepared by: Reather Laurence  Exercises Seated March - 2-3 x daily - 7 x weekly - 2 sets - 10 reps Seated Long Arc Quad - 2-3 x daily - 7 x weekly - 2 sets - 10 reps Seated Ankle Pumps - 2-3 x daily - 7 x weekly - 2 sets - 10 reps Sit to Stand with Armchair - 2-3 x daily - 7 x weekly - 1 sets - 5 reps

## 2022-01-14 ENCOUNTER — Ambulatory Visit: Payer: Self-pay | Attending: Physician Assistant | Admitting: Physical Therapy

## 2022-01-14 ENCOUNTER — Encounter: Payer: Self-pay | Admitting: Physical Therapy

## 2022-01-14 DIAGNOSIS — M6281 Muscle weakness (generalized): Secondary | ICD-10-CM | POA: Insufficient documentation

## 2022-01-14 DIAGNOSIS — R262 Difficulty in walking, not elsewhere classified: Secondary | ICD-10-CM | POA: Insufficient documentation

## 2022-01-14 NOTE — Therapy (Signed)
OUTPATIENT PHYSICAL THERAPY LOWER EXTREMITY EVALUATION   Patient Name: Jesse Bass MRN: [id number] DOB:[birth year], 56 y.o., male Today's Date: 01/14/2022   PT End of Session - 01/14/22 0900     Visit Number 1    Date for PT Re-Evaluation 04/15/22    Authorization Type Medicaid    PT Start Time [phone]    PT Stop Time 0930    PT Time Calculation (min) 39 min    Activity Tolerance Patient limited by fatigue    Behavior During Therapy [facility] for tasks assessed/performed             Past Medical History:  Diagnosis Date   Morbid obesity (HCC)    OSA on CPAP    Polysubstance abuse (HCC)    Past Surgical History:  Procedure Laterality Date   KNEE SURGERY  2000   Left   Patient Active Problem List   Diagnosis Date Noted   Hypokalemia 08/30/2016   Thrombocytopenia (HCC) 08/30/2016   Syncope and collapse 08/30/2016   HTN (hypertension) 08/30/2016   Pedal edema 08/30/2016   Obesity, Class III, BMI 40-49.9 (morbid obesity) (HCC) 08/30/2016   Polysubstance abuse (HCC)    OSA (obstructive sleep apnea) 02/11/2011    PCP: Lorenda Peck PROVIDER: Lorenda Peck DIAG: weakness, debility  THERAPY DIAG:  Muscle weakness (generalized)  Difficulty in walking, not elsewhere classified  Rationale for Evaluation and Treatment Rehabilitation  ONSET DATE: 12/2021  SUBJECTIVE:   SUBJECTIVE STATEMENT: Patient reports taht he became homeless and was living in his vehicle, had terrible health havits and did not move much due to being in the [place].  Reports that over the past few years since covid some things went downhill.He reports difficulty walking and very weak.    PERTINENT HISTORY: Back pain, left knee surgery, spinal stenosis, DDD  PAIN:  Are you having pain? Yes: NPRS scale: 2/10 Pain location: back and left knee Pain description: ache, tight Aggravating factors: walking, moving, pain can be up to 8/10 Relieving factors: medication  PRECAUTIONS: None  WEIGHT  BEARING RESTRICTIONS No  FALLS:  Has patient fallen in last 6 months? No  LIVING ENVIRONMENT: Lives with: lives alone Lives in: House/apartment Stairs: No Has following equipment at home: Environmental consultant - 2 wheeled, Crutches, and Wheelchair (manual)  OCCUPATION: disability  PLOF: Independent with household mobility with device and Independent with homemaking with wheelchair  PATIENT GOALS walk, be stronger, be better   OBJECTIVE:   COGNITION:  Overall cognitive status: Within functional limits for tasks assessed     SENSATION: WFL POSTURE: rounded shoulders and forward head  PALPATION: Sore and tender in the right low back and buttock  LOWER EXTREMITY ROM:  Active ROM Right eval Left eval  Hip flexion    Hip extension    Hip abduction    Hip adduction    Hip internal rotation    Hip external rotation    Knee flexion 100 105  Knee extension 10 23  Ankle dorsiflexion    Ankle plantarflexion    Ankle inversion    Ankle eversion     (Blank rows = not tested)  LOWER EXTREMITY MMT:  MMT Right eval Left eval  Hip flexion 4- 4  Hip extension    Hip abduction 4 4  Hip adduction    Hip internal rotation    Hip external rotation    Knee flexion 4 4  Knee extension 4 4-  Ankle dorsiflexion    Ankle plantarflexion    Ankle  inversion    Ankle eversion     (Blank rows = not tested)  FUNCTIONAL TESTS:  Timed up and go (TUG): 34 with FWW  GAIT: Distance walked: 50 feet Assistive device utilized: Walker - 2 wheeled Level of assistance: CGA Comments: very poor knee strength, knees in flexion, knees give some, antalgic on tthe left, stooped posture    TODAY'S TREATMENT:  01/14/22 Nustep level 5 x 6 minutes   PATIENT EDUCATION:  Education details: Green tband, rows, extension, bicep curls and chest press Person educated: Patient Education method: Explanation and Demonstration Education comprehension: verbalized understanding   HOME EXERCISE PROGRAM: As  noted above  ASSESSMENT:  CLINICAL IMPRESSION: Patient is a 56  y.o. male who was seen today for physical therapy evaluation and treatment for debility, weakness and difficulty walking.  He reports a downward spiral of health over the years while he was homeless and living in a [place], he reports that he is really wanting to work on himself and get better and walk, he is currently in a w/c for most locomotion, can walk short distances with a walker, left knee tends to remain in flexion and seems to give on him. He does c/o left knee and right low back pain.    OBJECTIVE IMPAIRMENTS Abnormal gait, cardiopulmonary status limiting activity, decreased activity tolerance, decreased balance, decreased endurance, difficulty walking, decreased ROM, decreased strength, increased muscle spasms, impaired flexibility, improper body mechanics, obesity, and pain.   ACTIVITY LIMITATIONS carrying, lifting, bending, sitting, standing, squatting, stairs, transfers, bathing, toileting, dressing, and locomotion level  PARTICIPATION LIMITATIONS: meal prep, cleaning, laundry, shopping, community activity, and yard work  PERSONAL FACTORS Fitness, Past/current experiences, Social background, and 1-2 comorbidities:    are also affecting patient's functional outcome.   REHAB POTENTIAL: Good  CLINICAL DECISION MAKING: Stable/uncomplicated  EVALUATION COMPLEXITY: Low   GOALS: Goals reviewed with patient? Yes  SHORT TERM GOALS: Target date: 01/28/22 Independent with initial HEP Goal status: INITIAL  LONG TERM GOALS: Target date: 04/08/22  Increase left knee extension to 10 degrees from full extension Goal status: INITIAL  2.  Decrease TUG time to 19 seconds Goal status: INITIAL  3.  Report pain in the back decreased 25% Goal status: INITIAL  4.  Walk with AD x 500 feet without rest Goal status: INITIAL  PLAN: PT FREQUENCY: 1x/week  PT DURATION: 12 weeks  PLANNED INTERVENTIONS: Therapeutic exercises,  Therapeutic activity, Neuromuscular re-education, Balance training, Gait training, Patient/Family education, Self Care, Joint mobilization, Stair training, Electrical stimulation, Moist heat, Taping, and Manual therapy  PLAN FOR NEXT SESSION: patient has insurance that does not cover PT, he absolutely needs help so we agreed to see him 1x/week and try to get him moving and stronger   Trevione Wert W, PT 01/14/2022, 9:01 AM

## 2022-01-23 ENCOUNTER — Ambulatory Visit: Payer: Self-pay | Admitting: Physical Therapy

## 2022-01-23 DIAGNOSIS — M6281 Muscle weakness (generalized): Secondary | ICD-10-CM

## 2022-01-23 DIAGNOSIS — R262 Difficulty in walking, not elsewhere classified: Secondary | ICD-10-CM

## 2022-01-23 NOTE — Therapy (Signed)
OUTPATIENT PHYSICAL THERAPY LOWER EXTREMITY EVALUATION   Patient Name: Jesse Bass MRN: [id number] DOB:[birth year], 56 y.o., male Today's Date: 01/23/2022   PT End of Session - 01/23/22 0842     Visit Number 2    Date for PT Re-Evaluation 04/15/22    Authorization Type Medicaid    PT Start Time 0845    PT Stop Time 0930    PT Time Calculation (min) 45 min             Past Medical History:  Diagnosis Date   Morbid obesity ([place])    OSA on CPAP    Polysubstance abuse ([place])    Past Surgical History:  Procedure Laterality Date   KNEE SURGERY  2000   Left   Patient Active Problem List   Diagnosis Date Noted   Hypokalemia 08/30/2016   Thrombocytopenia ([place]) 08/30/2016   Syncope and collapse 08/30/2016   HTN (hypertension) 08/30/2016   Pedal edema 08/30/2016   Obesity, Class III, BMI 40-49.9 (morbid obesity) ([place]) 08/30/2016   Polysubstance abuse (HCC)    OSA (obstructive sleep apnea) 02/11/2011    PCP: Coralee Pesa PROVIDER: Coralee Pesa DIAG: weakness, debility  THERAPY DIAG:  Muscle weakness (generalized)  Difficulty in walking, not elsewhere classified  Rationale for Evaluation and Treatment Rehabilitation  ONSET DATE: 12/2021  SUBJECTIVE:   SUBJECTIVE STATEMENT: Really been working out this week so sore esp Left knee with chronic arthritis. Overall feeling better. Walking more at home just use w/c outside home  PERTINENT HISTORY: Back pain, left knee surgery, spinal stenosis, DDD  PAIN:  Are you having pain? Yes: NPRS scale: 2/10 Pain location: back and left knee Pain description: ache, tight Aggravating factors: walking, moving, pain can be up to 8/10 Relieving factors: medication  PRECAUTIONS: None  WEIGHT BEARING RESTRICTIONS No  FALLS:  Has patient fallen in last 6 months? No  LIVING ENVIRONMENT: Lives with: lives alone Lives in: House/apartment Stairs: No Has following equipment at home: Environmental consultant - 2 wheeled, Crutches,  and Wheelchair (manual)  OCCUPATION: disability  PLOF: Independent with household mobility with device and Independent with homemaking with wheelchair  PATIENT GOALS walk, be stronger, be better   OBJECTIVE:   COGNITION:  Overall cognitive status: Within functional limits for tasks assessed     SENSATION: WFL POSTURE: rounded shoulders and forward head  PALPATION: Sore and tender in the right low back and buttock  LOWER EXTREMITY ROM:  Active ROM Right eval Left eval Left /RT 01/23/22  Hip flexion     Hip extension     Hip abduction     Hip adduction     Hip internal rotation     Hip external rotation     Knee flexion 100 105   Knee extension 10 23 8/3  Ankle dorsiflexion     Ankle plantarflexion     Ankle inversion     Ankle eversion      (Blank rows = not tested)  LOWER EXTREMITY MMT:  MMT Right eval Left eval  Hip flexion 4- 4  Hip extension    Hip abduction 4 4  Hip adduction    Hip internal rotation    Hip external rotation    Knee flexion 4 4  Knee extension 4 4-  Ankle dorsiflexion    Ankle plantarflexion    Ankle inversion    Ankle eversion     (Blank rows = not tested)  FUNCTIONAL TESTS:  Timed up and go (TUG): 34 with FWW  GAIT: Distance walked: 50 feet Assistive device utilized: Walker - 2 wheeled Level of assistance: CGA Comments: very poor knee strength, knees in flexion, knees give some, antalgic on tthe left, stooped posture    TODAY'S TREATMENT:  01/23/22 Nustep L 5 6 min Amb ex to ex in clinic with RW STS with wt ball chest press 10 x Standing 3# with RW marching 10 x each Standing 3# hip flex SL 10x , with SL on left more of a RT step as decreased tolerance to SLS on LEft LE. Then abd 10x Standing SHld ext,row and rotation 2 sets 10 green tband 3# LAQ TKE hold 3 sec 2 sets 10      01/14/22 Nustep level 5 x 6 minutes   PATIENT EDUCATION:  Education details: Green tband, rows, extension, bicep curls and chest  press Person educated: Patient Education method: Explanation and Demonstration Education comprehension: verbalized understanding   HOME EXERCISE PROGRAM: As noted above  ASSESSMENT:  CLINICAL IMPRESSION: Pt arrived in stating doing better, doing HEP at home and walking a lot more, using w/c for community. Pt did amb in clinic with RW- very fwd flexed and left knee flexion. Pt tolerated ex fair and most difficulty with staindng ex that required more SLS on LLE. Encouraged pt to stay active and ex  OBJECTIVE IMPAIRMENTS Abnormal gait, cardiopulmonary status limiting activity, decreased activity tolerance, decreased balance, decreased endurance, difficulty walking, decreased ROM, decreased strength, increased muscle spasms, impaired flexibility, improper body mechanics, obesity, and pain.   ACTIVITY LIMITATIONS carrying, lifting, bending, sitting, standing, squatting, stairs, transfers, bathing, toileting, dressing, and locomotion level  PARTICIPATION LIMITATIONS: meal prep, cleaning, laundry, shopping, community activity, and yard work  [place], Past/current experiences, Social background, and 1-2 comorbidities:    are also affecting patient's functional outcome.   REHAB POTENTIAL: Good  CLINICAL DECISION MAKING: Stable/uncomplicated  EVALUATION COMPLEXITY: Low   GOALS: Goals reviewed with patient? Yes  SHORT TERM GOALS: Target date: 01/28/22 Independent with initial HEP Goal status: 01/23/22 met LONG TERM GOALS: Target date: 04/08/22  Increase left knee extension to 10 degrees from full extension Goal status: 01/23/22 progressing  2.  Decrease TUG time to 19 seconds Goal status: INITIAL  3.  Report pain in the back decreased 25% Goal status: INITIAL  4.  Walk with AD x 500 feet without rest Goal status: INITIAL  PLAN: PT FREQUENCY: 1x/week  PT DURATION: 12 weeks  PLANNED INTERVENTIONS: Therapeutic exercises, Therapeutic activity, Neuromuscular  re-education, Balance training, Gait training, Patient/Family education, Self Care, Joint mobilization, Stair training, Electrical stimulation, Moist heat, Taping, and Manual therapy  PLAN FOR NEXT SESSION: patient has insurance that does not cover PT, he absolutely needs help so we agreed to see him 1x/week and try to get him moving and stronger. [employer]. [employer] coverage next month   Geana Walts,ANGIE, PTA 01/23/2022, 8:46 AM [address]. [place], [place], [address] Phone: [phone]   Fax:  [phone]  Patient Details  Name: Jesse Bass MRN: [id number] Date of Birth: [birth year] Referring Provider:  No ref. provider found  Encounter Date: 01/23/2022   Vickii Penna 01/23/2022, 8:45 AM  [address] [place], [place], [address] Phone: [phone]   Fax:  [phone]

## 2022-01-30 ENCOUNTER — Ambulatory Visit: Payer: Self-pay | Admitting: Physical Therapy

## 2022-02-05 ENCOUNTER — Ambulatory Visit: Payer: Self-pay | Admitting: Physical Therapy

## 2022-02-12 ENCOUNTER — Ambulatory Visit: Payer: Self-pay | Attending: Physician Assistant | Admitting: Physical Therapy

## 2022-02-12 DIAGNOSIS — M6281 Muscle weakness (generalized): Secondary | ICD-10-CM | POA: Insufficient documentation

## 2022-02-12 DIAGNOSIS — R262 Difficulty in walking, not elsewhere classified: Secondary | ICD-10-CM | POA: Insufficient documentation

## 2022-02-12 NOTE — Therapy (Signed)
OUTPATIENT PHYSICAL THERAPY LOWER EXTREMITY  Patient Name: Jesse Bass MRN: [id number] DOB:24-Feb-1966, 56 y.o., male Today's Date: 02/12/2022   PT End of Session - 02/12/22 0836     Visit Number 3    Date for PT Re-Evaluation 04/15/22    Authorization Type Medicaid    PT Start Time 0830    PT Stop Time 0920    PT Time Calculation (min) 50 min             Past Medical History:  Diagnosis Date   Morbid obesity ([place])    OSA on CPAP    Polysubstance abuse ([place])    Past Surgical History:  Procedure Laterality Date   KNEE SURGERY  2000   Left   Patient Active Problem List   Diagnosis Date Noted   Hypokalemia 08/30/2016   Thrombocytopenia ([place]) 08/30/2016   Syncope and collapse 08/30/2016   HTN (hypertension) 08/30/2016   Pedal edema 08/30/2016   Obesity, Class III, BMI 40-49.9 (morbid obesity) ([place]) 08/30/2016   Polysubstance abuse ([facility])    OSA (obstructive sleep apnea) 02/11/2011    PCP: Coralee Pesa PROVIDER: Coralee Pesa DIAG: weakness, debility  THERAPY DIAG:  Muscle weakness (generalized)  Difficulty in walking, not elsewhere classified  Rationale for Evaluation and Treatment Rehabilitation  ONSET DATE: 12/2021  SUBJECTIVE:   SUBJECTIVE STATEMENT: Had to cancel last time d/t pain. Still doing ex and walking at home PERTINENT HISTORY: Back pain, left knee surgery, spinal stenosis, DDD  PAIN:  Are you having pain? Yes: NPRS scale: 6/10 Pain location:  left knee Pain description: ache, tight Aggravating factors: walking, moving, pain can be up to 8/10 Relieving factors: medication  PRECAUTIONS: None  WEIGHT BEARING RESTRICTIONS No  FALLS:  Has patient fallen in last 6 months? No  LIVING ENVIRONMENT: Lives with: lives alone Lives in: House/apartment Stairs: No Has following equipment at home: Environmental consultant - 2 wheeled, Crutches, and Wheelchair (manual)  OCCUPATION: disability  PLOF: Independent with household mobility with device  and Independent with homemaking with wheelchair  PATIENT GOALS walk, be stronger, be better   OBJECTIVE:   COGNITION:  Overall cognitive status: Within functional limits for tasks assessed     SENSATION: WFL POSTURE: rounded shoulders and forward head  PALPATION: Sore and tender in the right low back and buttock  LOWER EXTREMITY ROM:  Active ROM Right eval Left eval Left /RT 01/23/22 Left /RT 02/12/22  Hip flexion      Hip extension      Hip abduction      Hip adduction      Hip internal rotation      Hip external rotation      Knee flexion 100 105  105/110  Knee extension 10 23 8/3 4/2  Ankle dorsiflexion      Ankle plantarflexion      Ankle inversion      Ankle eversion       (Blank rows = not tested)  LOWER EXTREMITY MMT:  MMT Right eval Left eval RT/Left 02/12/22  Hip flexion 4- 4 4/4  Hip extension     Hip abduction 4 4 4/4  Hip adduction     Hip internal rotation     Hip external rotation     Knee flexion 4 4 4/4  Knee extension 4 4- 4/4  Ankle dorsiflexion     Ankle plantarflexion     Ankle inversion     Ankle eversion      (Blank rows = not  tested)  FUNCTIONAL TESTS:  Timed up and go (TUG): 34 with FWW   02/12/22 with RW 14.61 sec STS 5 x without UE 15.2 sec  GAIT: Distance walked: 50 feet Assistive device utilized: Environmental consultant - 2 wheeled Level of assistance: CGA Comments: very poor knee strength, knees in flexion, knees give some, antalgic on tthe left, stooped posture    TODAY'S TREATMENT:  02/12/22  Nustep L 6 8 min LE only See above TUG and STS  Amb with RW 375 feet fwd trunk flex with increased wt on UE on walker STS with wt ball chest press 10 x Standing shld ext and row blue tband 2 sets 10 Green tband HS curl 2 sets 10 LAQ 5# 2 sets 10 5# hip flex and abd 2 sets 10   01/23/22 Nustep L 5 6 min Amb ex to ex in clinic with RW STS with wt ball chest press 10 x Standing 3# with RW marching 10 x each Standing 3# hip flex SL 10x  , with SL on left more of a RT step as decreased tolerance to SLS on LEft LE. Then abd 10x Standing SHld ext,row and rotation 2 sets 10 green tband 3# LAQ TKE hold 3 sec 2 sets 10      01/14/22 Nustep level 5 x 6 minutes   PATIENT EDUCATION:  Education details: Green tband, rows, extension, bicep curls and chest press Person educated: Patient Education method: Explanation and Demonstration Education comprehension: verbalized understanding   HOME EXERCISE PROGRAM: As noted above  ASSESSMENT:  CLINICAL IMPRESSION: Progressing with all goals- TUG improved, STS baseline est, increased ROM and MT about the same tested but func better- see above measurements. Progressed gait and ex OBJECTIVE IMPAIRMENTS Abnormal gait, cardiopulmonary status limiting activity, decreased activity tolerance, decreased balance, decreased endurance, difficulty walking, decreased ROM, decreased strength, increased muscle spasms, impaired flexibility, improper body mechanics, obesity, and pain.   ACTIVITY LIMITATIONS carrying, lifting, bending, sitting, standing, squatting, stairs, transfers, bathing, toileting, dressing, and locomotion level  PARTICIPATION LIMITATIONS: meal prep, cleaning, laundry, shopping, community activity, and yard work  [place], Past/current experiences, Social background, and 1-2 comorbidities:    are also affecting patient's functional outcome.   REHAB POTENTIAL: Good  CLINICAL DECISION MAKING: Stable/uncomplicated  EVALUATION COMPLEXITY: Low   GOALS: Goals reviewed with patient? Yes  SHORT TERM GOALS: Target date: 01/28/22 Independent with initial HEP Goal status: 01/23/22 met LONG TERM GOALS: Target date: 04/08/22  Increase left knee extension to 10 degrees from full extension Goal status: 01/23/22 progressing10/5/23 met  2.  Decrease TUG time to 19 seconds Goal status: 02/12/22 met  3.  Report pain in the back decreased 25% Goal status: 02/12/22  progressing  4.  Walk with AD x 500 feet without rest Goal status: 02/12/22 progressing  PLAN: PT FREQUENCY: 1x/week  PT DURATION: 12 weeks  PLANNED INTERVENTIONS: Therapeutic exercises, Therapeutic activity, Neuromuscular re-education, Balance training, Gait training, Patient/Family education, Self Care, Joint mobilization, Stair training, Electrical stimulation, Moist heat, Taping, and Manual therapy  PLAN FOR NEXT SESSION: patient has insurance that does not cover PT, he absolutely needs help so we agreed to see him 1x/week and try to get him moving and stronger. [employer]. [employer] coverage next month   Laronn Devonshire,ANGIE, PTA 02/12/2022, 8:37 AM [place]. [place], [place], [address] Phone: [phone]   Fax:  [phone]  Patient Details  Name: Sonu Kruckenberg MRN: [id number] Date of Birth: 04/14/1966 Referring Provider:  No ref. provider found  Encounter Date: 02/12/2022   Vickii Penna 02/12/2022, 8:37 AM  [address]. [place], [place], [address] Phone: [phone]   Fax:  [place]. [place], [place], [address] Phone: [phone]   Fax:  [phone]  Patient Details  Name: Ladarion Munyon MRN: [id number] Date of Birth: 1965-09-27 Referring Provider:  No ref. provider found  Encounter Date: 02/12/2022   Vickii Penna 02/12/2022, 8:37 AM  [place]. [place], [place], [address] Phone: [phone]   Fax:  [phone]

## 2022-03-02 ENCOUNTER — Ambulatory Visit: Payer: Self-pay

## 2022-03-10 ENCOUNTER — Ambulatory Visit: Payer: Self-pay | Admitting: Physical Therapy

## 2022-03-16 ENCOUNTER — Ambulatory Visit: Payer: Medicaid Other | Admitting: Physical Therapy

## 2022-04-28 ENCOUNTER — Ambulatory Visit: Payer: Medicaid Other | Admitting: Physical Therapy

## 2022-05-08 NOTE — Therapy (Incomplete)
OUTPATIENT PHYSICAL THERAPY LOWER EXTREMITY EVALUATION   Patient Name: Jesse Bass MRN: [id number] DOB:20-Dec-1965, 56 y.o., male Today's Date: 05/08/2022  END OF SESSION:   Past Medical History:  Diagnosis Date   Morbid obesity ([facility])    OSA on CPAP    Polysubstance abuse ([facility])    Past Surgical History:  Procedure Laterality Date   KNEE SURGERY  2000   Left   Patient Active Problem List   Diagnosis Date Noted   Hypokalemia 08/30/2016   Thrombocytopenia ([facility]) 08/30/2016   Syncope and collapse 08/30/2016   HTN (hypertension) 08/30/2016   Pedal edema 08/30/2016   Obesity, Class III, BMI 40-49.9 (morbid obesity) ([facility]) 08/30/2016   Polysubstance abuse ([facility])    OSA (obstructive sleep apnea) 02/11/2011    PCP: No PCP  REFERRING PROVIDER: Sherrie Mustache  REFERRING DIAG: R29.898, E66.01, [email or web address]  THERAPY DIAG:  No diagnosis found.  Rationale for Evaluation and Treatment: Rehabilitation  ONSET DATE: ***  SUBJECTIVE:   SUBJECTIVE STATEMENT: ***  PERTINENT HISTORY: Morbid obesity  PAIN:  Are you having pain? {OPRCPAIN:27236}  PRECAUTIONS: {Therapy precautions:24002}  WEIGHT BEARING RESTRICTIONS: {Yes ***/No:24003}  FALLS:  Has patient fallen in last 6 months? {fallsyesno:27318}  LIVING ENVIRONMENT: Lives with: {OPRC lives with:25569::"lives with their family"} Lives in: {Lives in:[address]} Stairs: {opstairs:27293} Has following equipment at home: {Assistive devices:23999}  OCCUPATION: ***  PLOF: {PLOF:24004}  PATIENT GOALS: ***  NEXT MD VISIT:   OBJECTIVE:   DIAGNOSTIC FINDINGS: ***  PATIENT SURVEYS:  {rehab surveys:24030}  COGNITION: Overall cognitive status: {cognition:24006}     SENSATION: {sensation:27233}  EDEMA:  {edema:24020}  MUSCLE LENGTH: Hamstrings: Right *** deg; Left *** deg Thomas test: Right *** deg; Left *** deg  POSTURE: {posture:25561}  PALPATION: ***  LOWER EXTREMITY ROM:  {AROM/PROM:27142} ROM Right eval  Left eval  Hip flexion    Hip extension    Hip abduction    Hip adduction    Hip internal rotation    Hip external rotation    Knee flexion    Knee extension    Ankle dorsiflexion    Ankle plantarflexion    Ankle inversion    Ankle eversion     (Blank rows = not tested)  LOWER EXTREMITY MMT:  MMT Right eval Left eval  Hip flexion    Hip extension    Hip abduction    Hip adduction    Hip internal rotation    Hip external rotation    Knee flexion    Knee extension    Ankle dorsiflexion    Ankle plantarflexion    Ankle inversion    Ankle eversion     (Blank rows = not tested)  LOWER EXTREMITY SPECIAL TESTS:  {LEspecialtests:26242}  FUNCTIONAL TESTS:  5 times sit to stand: *** Timed up and go (TUG): ***  GAIT: Distance walked: *** Assistive device utilized: {Assistive devices:23999} Level of assistance: {Levels of assistance:24026} Comments: ***   TODAY'S TREATMENT:  DATE: ***    PATIENT EDUCATION:  Education details: *** Person educated: {Person educated:25204} Education method: {Education Method:25205} Education comprehension: {Education Comprehension:25206}  HOME EXERCISE PROGRAM: ***  ASSESSMENT:  CLINICAL IMPRESSION: Patient is a *** y.o. *** who was seen today for physical therapy evaluation and treatment for ***.   OBJECTIVE IMPAIRMENTS: {opptimpairments:25111}.   ACTIVITY LIMITATIONS: {activitylimitations:27494}  PARTICIPATION LIMITATIONS: {participationrestrictions:25113}  PERSONAL FACTORS: {Personal factors:25162} are also affecting patient's functional outcome.   REHAB POTENTIAL: {rehabpotential:25112}  CLINICAL DECISION MAKING: {clinical decision making:25114}  EVALUATION COMPLEXITY: {Evaluation complexity:25115}   GOALS: Goals reviewed with patient? {yes/no:20286}  SHORT TERM GOALS: Target date:  *** *** Baseline: Goal status: {GOALSTATUS:25110}  2.  *** Baseline:  Goal status: {GOALSTATUS:25110}  3.  *** Baseline:  Goal status: {GOALSTATUS:25110}  4.  *** Baseline:  Goal status: {GOALSTATUS:25110}  5.  *** Baseline:  Goal status: {GOALSTATUS:25110}  6.  *** Baseline:  Goal status: {GOALSTATUS:25110}  LONG TERM GOALS: Target date: ***  *** Baseline:  Goal status: {GOALSTATUS:25110}  2.  *** Baseline:  Goal status: {GOALSTATUS:25110}  3.  *** Baseline:  Goal status: {GOALSTATUS:25110}  4.  *** Baseline:  Goal status: {GOALSTATUS:25110}  5.  *** Baseline:  Goal status: {GOALSTATUS:25110}  6.  *** Baseline:  Goal status: {GOALSTATUS:25110}   PLAN:  PT FREQUENCY: {rehab frequency:25116}  PT DURATION: {rehab duration:25117}  PLANNED INTERVENTIONS: {rehab planned interventions:25118::"Therapeutic exercises","Therapeutic activity","Neuromuscular re-education","Balance training","Gait training","Patient/Family education","Self Care","Joint mobilization"}  PLAN FOR NEXT SESSION: ***   Cassie Freer, PT 05/08/2022, 8:59 AM

## 2022-05-12 ENCOUNTER — Ambulatory Visit: Payer: Medicaid Other | Attending: Physician Assistant

## 2022-05-12 DIAGNOSIS — G8929 Other chronic pain: Secondary | ICD-10-CM | POA: Diagnosis present

## 2022-05-12 DIAGNOSIS — M545 Low back pain, unspecified: Secondary | ICD-10-CM | POA: Insufficient documentation

## 2022-05-12 DIAGNOSIS — M6281 Muscle weakness (generalized): Secondary | ICD-10-CM | POA: Insufficient documentation

## 2022-05-12 DIAGNOSIS — R262 Difficulty in walking, not elsewhere classified: Secondary | ICD-10-CM | POA: Diagnosis present

## 2022-05-18 ENCOUNTER — Ambulatory Visit: Payer: Medicaid Other | Admitting: Physical Therapy

## 2022-05-25 ENCOUNTER — Ambulatory Visit: Payer: Medicaid Other | Admitting: Physical Therapy

## 2022-06-01 NOTE — Therapy (Incomplete)
OUTPATIENT PHYSICAL THERAPY LOWER EXTREMITY EVALUATION   Patient Name: Jesse Bass MRN: [id number] DOB:[birth year], 57 y.o., male Today's Date: 05/12/2022  END OF SESSION:  PT End of Session - 05/12/22 1154     Visit Number 1    Date for PT Re-Evaluation 07/28/22    Authorization Type Medicaid    PT Start Time 1154    PT Stop Time 1230    PT Time Calculation (min) 36 min    Activity Tolerance Patient tolerated treatment well;Patient limited by fatigue    Behavior During Therapy [facility] for tasks assessed/performed             Past Medical History:  Diagnosis Date   Morbid obesity ([place])    OSA on CPAP    Polysubstance abuse ([place])    Past Surgical History:  Procedure Laterality Date   KNEE SURGERY  2000   Left   Patient Active Problem List   Diagnosis Date Noted   Hypokalemia 08/30/2016   Thrombocytopenia ([place]) 08/30/2016   Syncope and collapse 08/30/2016   HTN (hypertension) 08/30/2016   Pedal edema 08/30/2016   Obesity, Class III, BMI 40-49.9 (morbid obesity) ([place]) 08/30/2016   Polysubstance abuse ([facility])    OSA (obstructive sleep apnea) 02/11/2011    PCP: No PCP  REFERRING PROVIDER: Park Meo  REFERRING DIAG: R29.898, E66.01, [email or web address]  THERAPY DIAG:  Muscle weakness (generalized)  Difficulty in walking, not elsewhere classified  Chronic low back pain, unspecified back pain laterality, unspecified whether sciatica present  Rationale for Evaluation and Treatment: Rehabilitation  ONSET DATE: 04/13/22  SUBJECTIVE:   SUBJECTIVE STATEMENT: It's been challenging since the last time I have been here. I had some issues with insurance. I tried to stay motivated and do my exercises I learned here. I don't eat meat anymore and have been eating mostly organic foods. Will see my PCP tomorrow. I am to get in and out the shower better and can stand and dry off which is a huge improvement.   PERTINENT HISTORY: Morbid obesity  PAIN:  Are you having pain? Yes:  NPRS scale: 7/10 Pain location: both knees and low back Pain description: chronic, constant ache Aggravating factors: exercises  Relieving factors: pain meds, laying down  PRECAUTIONS: None  WEIGHT BEARING RESTRICTIONS: No  FALLS:  Has patient fallen in last 6 months? No  LIVING ENVIRONMENT: Lives with: lives alone Lives in: House/apartment Stairs: No Has following equipment at home: Environmental consultant - 2 wheeled, Crutches, and Wheelchair (manual)  OCCUPATION: not employed  PLOF: Independent with basic ADLs and Independent with household mobility without device  PATIENT GOALS: Getting out of the wheelchair, work on my legs  NEXT MD VISIT:   OBJECTIVE:   COGNITION: Overall cognitive status: Within functional limits for tasks assessed     SENSATION: WFL  MUSCLE LENGTH: Hamstrings: bilateral tightness  POSTURE: rounded shoulders, increased lumbar lordosis, and flexed trunk    LOWER EXTREMITY ROM: grossly WFL, L knee extension limited to 10d   LOWER EXTREMITY MMT:  MMT Right eval Left eval  Hip flexion 4 4  Hip extension    Hip abduction    Hip adduction    Hip internal rotation    Hip external rotation    Knee flexion 4 4  Knee extension 3+ 3+  Ankle dorsiflexion 5 5  Ankle plantarflexion    Ankle inversion    Ankle eversion     (Blank rows = not tested)   FUNCTIONAL TESTS:  5 times sit to stand: 29.77s  Timed up and go (TUG): 23.06s  GAIT: Distance walked: 74ft Assistive device utilized: None Level of assistance: Modified independence Comments: decreased step and stance length, antalgic gait, flexed trunk   TODAY'S TREATMENT:                                                                                                                              DATE:  06/02/22 NuStep STS with chest press LAQ Standing rows and ext Standing hip flexion and abd  Walking    05/12/22- EVAL    PATIENT EDUCATION:  Education details: POC, aquatic therapy Person  educated: Patient Education method: Explanation Education comprehension: verbalized understanding  HOME EXERCISE PROGRAM: TBD  ASSESSMENT:  CLINICAL IMPRESSION: Patient is a 57 y.o. male who was seen today for physical therapy evaluation and treatment for difficulty walking, LE weakness, and chronic pain. Patient presents with overall decreased tolerance and endurance, he also has difficulty with locomotion due to knee pain. 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 We will plan to do 1x a week aquatic and 1x a week land therapy. Patient will benefit from PT to address his pain, weakness, and difficulty walking to be able to become more mobile and less dependent with his wheelchair to increase his independence overall and with ADLs.  OBJECTIVE IMPAIRMENTS: Abnormal gait, decreased activity tolerance, decreased endurance, decreased mobility, difficulty walking, decreased ROM, decreased strength, obesity, and pain.   ACTIVITY LIMITATIONS: sitting, standing, squatting, stairs, transfers, and locomotion level  REHAB POTENTIAL: Good  CLINICAL DECISION MAKING: Stable/uncomplicated  EVALUATION COMPLEXITY: Low  GOALS: Goals reviewed with patient? Yes  SHORT TERM GOALS: Target date: 06/23/22  Patient will be independent with initial HEP. Goal status: INITIAL  2.  Patient will demonstrate decreased fall risk by scoring < 15 sec on TUG. Baseline: 23.06s Goal status: INITIAL   LONG TERM GOALS: Target date: 07/28/22  Patient will be independent with advanced/ongoing HEP to improve outcomes and carryover.  Goal status: INITIAL  2.  Patient will be able to ambulate 500' with good safety to access community.  Goal status: INITIAL   3.  Patient will demonstrate improved functional LE strength as demonstrated by 5xSTS to < 15s. Baseline: 29.77s Goal status:  INITIAL   PLAN:  PT FREQUENCY: 2x/week  PT DURATION: 10 weeks  PLANNED INTERVENTIONS: Therapeutic exercises, Therapeutic activity, Neuromuscular re-education, Balance training, Gait training, Patient/Family education, Self Care, Joint mobilization, Stair training, Aquatic Therapy, Cryotherapy, Moist heat, and Manual therapy  PLAN FOR NEXT SESSION: start gym activities,    [place], PT 05/12/2022, 12:40 PM

## 2022-06-02 ENCOUNTER — Ambulatory Visit: Payer: Medicaid Other

## 2022-06-05 NOTE — Therapy (Incomplete)
OUTPATIENT PHYSICAL THERAPY LOWER EXTREMITY EVALUATION   Patient Name: Jesse Bass MRN: [id number] DOB:[birth year], 57 y.o., male Today's Date: 05/12/2022  END OF SESSION:  PT End of Session - 05/12/22 1154     Visit Number 1    Date for PT Re-Evaluation 07/28/22    Authorization Type Medicaid    PT Start Time 1154    PT Stop Time 1230    PT Time Calculation (min) 36 min    Activity Tolerance Patient tolerated treatment well;Patient limited by fatigue    Behavior During Therapy [facility] for tasks assessed/performed             Past Medical History:  Diagnosis Date   Morbid obesity ([place])    OSA on CPAP    Polysubstance abuse ([place])    Past Surgical History:  Procedure Laterality Date   KNEE SURGERY  2000   Left   Patient Active Problem List   Diagnosis Date Noted   Hypokalemia 08/30/2016   Thrombocytopenia ([place]) 08/30/2016   Syncope and collapse 08/30/2016   HTN (hypertension) 08/30/2016   Pedal edema 08/30/2016   Obesity, Class III, BMI 40-49.9 (morbid obesity) ([place]) 08/30/2016   Polysubstance abuse ([facility])    OSA (obstructive sleep apnea) 02/11/2011    PCP: No PCP  REFERRING PROVIDER: Park Meo  REFERRING DIAG: R29.898, E66.01, [email or web address]  THERAPY DIAG:  Muscle weakness (generalized)  Difficulty in walking, not elsewhere classified  Chronic low back pain, unspecified back pain laterality, unspecified whether sciatica present  Rationale for Evaluation and Treatment: Rehabilitation  ONSET DATE: 04/13/22  SUBJECTIVE:   SUBJECTIVE STATEMENT: It's been challenging since the last time I have been here. I had some issues with insurance. I tried to stay motivated and do my exercises I learned here. I don't eat meat anymore and have been eating mostly organic foods. Will see my PCP tomorrow. I am to get in and out the shower better and can stand and dry off which is a huge improvement.   PERTINENT HISTORY: Morbid obesity  PAIN:  Are you having pain? Yes:  NPRS scale: 7/10 Pain location: both knees and low back Pain description: chronic, constant ache Aggravating factors: exercises  Relieving factors: pain meds, laying down  PRECAUTIONS: None  WEIGHT BEARING RESTRICTIONS: No  FALLS:  Has patient fallen in last 6 months? No  LIVING ENVIRONMENT: Lives with: lives alone Lives in: House/apartment Stairs: No Has following equipment at home: Environmental consultant - 2 wheeled, Crutches, and Wheelchair (manual)  OCCUPATION: not employed  PLOF: Independent with basic ADLs and Independent with household mobility without device  PATIENT GOALS: Getting out of the wheelchair, work on my legs  NEXT MD VISIT:   OBJECTIVE:   COGNITION: Overall cognitive status: Within functional limits for tasks assessed     SENSATION: WFL  MUSCLE LENGTH: Hamstrings: bilateral tightness  POSTURE: rounded shoulders, increased lumbar lordosis, and flexed trunk    LOWER EXTREMITY ROM: grossly WFL, L knee extension limited to 10d   LOWER EXTREMITY MMT:  MMT Right eval Left eval  Hip flexion 4 4  Hip extension    Hip abduction    Hip adduction    Hip internal rotation    Hip external rotation    Knee flexion 4 4  Knee extension 3+ 3+  Ankle dorsiflexion 5 5  Ankle plantarflexion    Ankle inversion    Ankle eversion     (Blank rows = not tested)   FUNCTIONAL TESTS:  5 times sit to stand: 29.77s  Timed up and go (TUG): 23.06s  GAIT: Distance walked: 34ft Assistive device utilized: None Level of assistance: Modified independence Comments: decreased step and stance length, antalgic gait, flexed trunk   TODAY'S TREATMENT:                                                                                                                              DATE:  06/09/22 NuStep STS with chest press LAQ Standing rows and ext Standing hip flexion and abd  Walking    05/12/22- EVAL    PATIENT EDUCATION:  Education details: POC, aquatic therapy Person  educated: Patient Education method: Explanation Education comprehension: verbalized understanding  HOME EXERCISE PROGRAM: TBD  ASSESSMENT:  CLINICAL IMPRESSION: Patient is a 57 y.o. male who was seen today for physical therapy evaluation and treatment for difficulty walking, LE weakness, and chronic pain. Patient presents with overall decreased tolerance and endurance, he also has difficulty with locomotion due to knee pain. 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 We will plan to do 1x a week aquatic and 1x a week land therapy. Patient will benefit from PT to address his pain, weakness, and difficulty walking to be able to become more mobile and less dependent with his wheelchair to increase his independence overall and with ADLs.  OBJECTIVE IMPAIRMENTS: Abnormal gait, decreased activity tolerance, decreased endurance, decreased mobility, difficulty walking, decreased ROM, decreased strength, obesity, and pain.   ACTIVITY LIMITATIONS: sitting, standing, squatting, stairs, transfers, and locomotion level  REHAB POTENTIAL: Good  CLINICAL DECISION MAKING: Stable/uncomplicated  EVALUATION COMPLEXITY: Low  GOALS: Goals reviewed with patient? Yes  SHORT TERM GOALS: Target date: 06/23/22  Patient will be independent with initial HEP. Goal status: INITIAL  2.  Patient will demonstrate decreased fall risk by scoring < 15 sec on TUG. Baseline: 23.06s Goal status: INITIAL   LONG TERM GOALS: Target date: 07/28/22  Patient will be independent with advanced/ongoing HEP to improve outcomes and carryover.  Goal status: INITIAL  2.  Patient will be able to ambulate 500' with good safety to access community.  Goal status: INITIAL   3.  Patient will demonstrate improved functional LE strength as demonstrated by 5xSTS to < 15s. Baseline: 29.77s Goal status:  INITIAL   PLAN:  PT FREQUENCY: 2x/week  PT DURATION: 10 weeks  PLANNED INTERVENTIONS: Therapeutic exercises, Therapeutic activity, Neuromuscular re-education, Balance training, Gait training, Patient/Family education, Self Care, Joint mobilization, Stair training, Aquatic Therapy, Cryotherapy, Moist heat, and Manual therapy  PLAN FOR NEXT SESSION: start gym activities,    [place], PT 05/12/2022, 12:40 PM

## 2022-06-09 ENCOUNTER — Ambulatory Visit: Payer: Medicaid Other

## 2022-06-15 ENCOUNTER — Ambulatory Visit: Payer: Medicaid Other | Attending: Physician Assistant

## 2022-06-15 DIAGNOSIS — R262 Difficulty in walking, not elsewhere classified: Secondary | ICD-10-CM | POA: Insufficient documentation

## 2022-06-15 DIAGNOSIS — M545 Low back pain, unspecified: Secondary | ICD-10-CM | POA: Insufficient documentation

## 2022-06-15 DIAGNOSIS — M6281 Muscle weakness (generalized): Secondary | ICD-10-CM | POA: Insufficient documentation

## 2022-06-15 DIAGNOSIS — G8929 Other chronic pain: Secondary | ICD-10-CM | POA: Insufficient documentation

## 2022-06-15 NOTE — Therapy (Incomplete)
OUTPATIENT PHYSICAL THERAPY LOWER EXTREMITY EVALUATION   Patient Name: Jesse Bass MRN: [id number] DOB:1965/08/29, 57 y.o., male Today's Date: 06/15/2022  END OF SESSION:    Past Medical History:  Diagnosis Date   Morbid obesity ([place])    OSA on CPAP    Polysubstance abuse [facility])    Past Surgical History:  Procedure Laterality Date   KNEE SURGERY  2000   Left   Patient Active Problem List   Diagnosis Date Noted   Hypokalemia 08/30/2016   Thrombocytopenia ([place]) 08/30/2016   Syncope and collapse 08/30/2016   HTN (hypertension) 08/30/2016   Pedal edema 08/30/2016   Obesity, Class III, BMI 40-49.9 (morbid obesity) ([place]) 08/30/2016   Polysubstance abuse (HCC)    OSA (obstructive sleep apnea) 02/11/2011    PCP: No PCP  REFERRING PROVIDER: Park Meo  REFERRING DIAG: R29.898, E66.01, [email or web address]  THERAPY DIAG:  No diagnosis found.  Rationale for Evaluation and Treatment: Rehabilitation  ONSET DATE: 04/13/22  SUBJECTIVE:   SUBJECTIVE STATEMENT: It's been challenging since the last time I have been here. I had some issues with insurance. I tried to stay motivated and do my exercises I learned here. I don't eat meat anymore and have been eating mostly organic foods. Will see my PCP tomorrow. I am to get in and out the shower better and can stand and dry off which is a huge improvement.   PERTINENT HISTORY: Morbid obesity  PAIN:  Are you having pain? Yes: NPRS scale: 7/10 Pain location: both knees and low back Pain description: chronic, constant ache Aggravating factors: exercises  Relieving factors: pain meds, laying down  PRECAUTIONS: None  WEIGHT BEARING RESTRICTIONS: No  FALLS:  Has patient fallen in last 6 months? No  LIVING ENVIRONMENT: Lives with: lives alone Lives in: House/apartment Stairs: No Has following equipment at home: Environmental consultant - 2 wheeled, Crutches, and Wheelchair (manual)  OCCUPATION: not employed  PLOF: Independent with basic ADLs and  Independent with household mobility without device  PATIENT GOALS: Getting out of the wheelchair, work on my legs  NEXT MD VISIT:   OBJECTIVE:   COGNITION: Overall cognitive status: Within functional limits for tasks assessed     SENSATION: WFL  MUSCLE LENGTH: Hamstrings: bilateral tightness  POSTURE: rounded shoulders, increased lumbar lordosis, and flexed trunk    LOWER EXTREMITY ROM: grossly WFL, L knee extension limited to 10d   LOWER EXTREMITY MMT:  MMT Right eval Left eval  Hip flexion 4 4  Hip extension    Hip abduction    Hip adduction    Hip internal rotation    Hip external rotation    Knee flexion 4 4  Knee extension 3+ 3+  Ankle dorsiflexion 5 5  Ankle plantarflexion    Ankle inversion    Ankle eversion     (Blank rows = not tested)   FUNCTIONAL TESTS:  5 times sit to stand: 29.77s Timed up and go (TUG): 23.06s  GAIT: Distance walked: 67ft Assistive device utilized: None Level of assistance: Modified independence Comments: decreased step and stance length, antalgic gait, flexed trunk   TODAY'S TREATMENT:  DATE: 05/12/22- EVAL    PATIENT EDUCATION:  Education details: POC, aquatic therapy Person educated: Patient Education method: Explanation Education comprehension: verbalized understanding  HOME EXERCISE PROGRAM: TBD  ASSESSMENT:  CLINICAL IMPRESSION: Patient is a 57 y.o. male who was seen today for physical therapy evaluation and treatment for difficulty walking, LE weakness, and chronic pain. Patient presents with overall decreased tolerance and endurance, he also has difficulty with locomotion due to knee pain. 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 We will plan to do 1x a week  aquatic and 1x a week land therapy. Patient will benefit from PT to address his pain, weakness, and difficulty walking to be able to become more mobile and less dependent with his wheelchair to increase his independence overall and with ADLs.  OBJECTIVE IMPAIRMENTS: Abnormal gait, decreased activity tolerance, decreased endurance, decreased mobility, difficulty walking, decreased ROM, decreased strength, obesity, and pain.   ACTIVITY LIMITATIONS: sitting, standing, squatting, stairs, transfers, and locomotion level  REHAB POTENTIAL: Good  CLINICAL DECISION MAKING: Stable/uncomplicated  EVALUATION COMPLEXITY: Low  GOALS: Goals reviewed with patient? Yes  SHORT TERM GOALS: Target date: 06/23/22  Patient will be independent with initial HEP. Goal status: INITIAL  2.  Patient will demonstrate decreased fall risk by scoring < 15 sec on TUG. Baseline: 23.06s Goal status: INITIAL   LONG TERM GOALS: Target date: 07/28/22  Patient will be independent with advanced/ongoing HEP to improve outcomes and carryover.  Goal status: INITIAL  2.  Patient will be able to ambulate 500' with good safety to access community.  Goal status: INITIAL   3.  Patient will demonstrate improved functional LE strength as demonstrated by 5xSTS to < 15s. Baseline: 29.77s Goal status: INITIAL   PLAN:  PT FREQUENCY: 2x/week  PT DURATION: 10 weeks  PLANNED INTERVENTIONS: Therapeutic exercises, Therapeutic activity, Neuromuscular re-education, Balance training, Gait training, Patient/Family education, Self Care, Joint mobilization, Stair training, Aquatic Therapy, Cryotherapy, Moist heat, and Manual therapy  PLAN FOR NEXT SESSION: start gym activities,    Mikaelyn Arthurs L Keyleigh Manninen, PT 06/15/2022, 8:04 AM

## 2022-06-24 ENCOUNTER — Ambulatory Visit: Payer: Medicaid Other

## 2022-06-24 NOTE — Therapy (Deleted)
OUTPATIENT PHYSICAL THERAPY LOWER EXTREMITY TREATMENT   Patient Name: Jesse Bass MRN: [id number] DOB:10/16/1965, 57 y.o., male Today's Date: 06/24/2022  END OF SESSION:    Past Medical History:  Diagnosis Date   Morbid obesity ([place])    OSA on CPAP    Polysubstance abuse ([place])    Past Surgical History:  Procedure Laterality Date   KNEE SURGERY  2000   Left   Patient Active Problem List   Diagnosis Date Noted   Hypokalemia 08/30/2016   Thrombocytopenia ([place]) 08/30/2016   Syncope and collapse 08/30/2016   HTN (hypertension) 08/30/2016   Pedal edema 08/30/2016   Obesity, Class III, BMI 40-49.9 (morbid obesity) ([place]) 08/30/2016   Polysubstance abuse (HCC)    OSA (obstructive sleep apnea) 02/11/2011    PCP: No PCP  REFERRING PROVIDER: Park Meo  REFERRING DIAG: R29.898, E66.01, [email or web address]  THERAPY DIAG:  No diagnosis found.  Rationale for Evaluation and Treatment: Rehabilitation  ONSET DATE: 04/13/22  SUBJECTIVE:   SUBJECTIVE STATEMENT: It's been challenging since the last time I have been here. I had some issues with insurance. I tried to stay motivated and do my exercises I learned here. I don't eat meat anymore and have been eating mostly organic foods. Will see my PCP tomorrow. I am to get in and out the shower better and can stand and dry off which is a huge improvement.   PERTINENT HISTORY: Morbid obesity  PAIN:  Are you having pain? Yes: NPRS scale: 7/10 Pain location: both knees and low back Pain description: chronic, constant ache Aggravating factors: exercises  Relieving factors: pain meds, laying down  PRECAUTIONS: None  WEIGHT BEARING RESTRICTIONS: No  FALLS:  Has patient fallen in last 6 months? No  LIVING ENVIRONMENT: Lives with: lives alone Lives in: House/apartment Stairs: No Has following equipment at home: Environmental consultant - 2 wheeled, Crutches, and Wheelchair (manual)  OCCUPATION: not employed  PLOF: Independent with basic ADLs and  Independent with household mobility without device  PATIENT GOALS: Getting out of the wheelchair, work on my legs  NEXT MD VISIT:   OBJECTIVE:   COGNITION: Overall cognitive status: Within functional limits for tasks assessed     SENSATION: WFL  MUSCLE LENGTH: Hamstrings: bilateral tightness  POSTURE: rounded shoulders, increased lumbar lordosis, and flexed trunk    LOWER EXTREMITY ROM: grossly WFL, L knee extension limited to 10d   LOWER EXTREMITY MMT:  MMT Right eval Left eval  Hip flexion 4 4  Hip extension    Hip abduction    Hip adduction    Hip internal rotation    Hip external rotation    Knee flexion 4 4  Knee extension 3+ 3+  Ankle dorsiflexion 5 5  Ankle plantarflexion    Ankle inversion    Ankle eversion     (Blank rows = not tested)   FUNCTIONAL TESTS:  5 times sit to stand: 29.77s Timed up and go (TUG): 23.06s  GAIT: Distance walked: 44f Assistive device utilized: None Level of assistance: Modified independence Comments: decreased step and stance length, antalgic gait, flexed trunk   TODAY'S TREATMENT:  DATE: 05/12/22- EVAL    PATIENT EDUCATION:  Education details: POC, aquatic therapy Person educated: Patient Education method: Explanation Education comprehension: verbalized understanding  HOME EXERCISE PROGRAM: TBD  ASSESSMENT:  CLINICAL IMPRESSION: Patient is a 57 y.o. male who was seen today for physical therapy evaluation and treatment for difficulty walking, LE weakness, and chronic pain. Patient presents with overall decreased tolerance and endurance, he also has difficulty with locomotion due to knee pain. 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 We will plan to do 1x a week  aquatic and 1x a week land therapy. Patient will benefit from PT to address his pain, weakness, and difficulty walking to be able to become more mobile and less dependent with his wheelchair to increase his independence overall and with ADLs.  OBJECTIVE IMPAIRMENTS: Abnormal gait, decreased activity tolerance, decreased endurance, decreased mobility, difficulty walking, decreased ROM, decreased strength, obesity, and pain.   ACTIVITY LIMITATIONS: sitting, standing, squatting, stairs, transfers, and locomotion level  REHAB POTENTIAL: Good  CLINICAL DECISION MAKING: Stable/uncomplicated  EVALUATION COMPLEXITY: Low  GOALS: Goals reviewed with patient? Yes  SHORT TERM GOALS: Target date: 06/23/22  Patient will be independent with initial HEP. Goal status: INITIAL  2.  Patient will demonstrate decreased fall risk by scoring < 15 sec on TUG. Baseline: 23.06s Goal status: INITIAL   LONG TERM GOALS: Target date: 07/28/22  Patient will be independent with advanced/ongoing HEP to improve outcomes and carryover.  Goal status: INITIAL  2.  Patient will be able to ambulate 500' with good safety to access community.  Goal status: INITIAL   3.  Patient will demonstrate improved functional LE strength as demonstrated by 5xSTS to < 15s. Baseline: 29.77s Goal status: INITIAL   PLAN:  PT FREQUENCY: 2x/week  PT DURATION: 10 weeks  PLANNED INTERVENTIONS: Therapeutic exercises, Therapeutic activity, Neuromuscular re-education, Balance training, Gait training, Patient/Family education, Self Care, Joint mobilization, Stair training, Aquatic Therapy, Cryotherapy, Moist heat, and Manual therapy  PLAN FOR NEXT SESSION: start gym activities,    Jesicca Dipierro L Finnigan Warriner, PT 06/24/2022, 9:11 AM
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
## 2022-07-08 ENCOUNTER — Ambulatory Visit: Payer: Medicaid Other

## 2022-07-08 DIAGNOSIS — M545 Low back pain, unspecified: Secondary | ICD-10-CM | POA: Diagnosis present

## 2022-07-08 DIAGNOSIS — G8929 Other chronic pain: Secondary | ICD-10-CM | POA: Diagnosis present

## 2022-07-08 DIAGNOSIS — M6281 Muscle weakness (generalized): Secondary | ICD-10-CM | POA: Diagnosis present

## 2022-07-08 DIAGNOSIS — R262 Difficulty in walking, not elsewhere classified: Secondary | ICD-10-CM

## 2022-07-16 NOTE — Therapy (Signed)
OUTPATIENT PHYSICAL THERAPY LOWER EXTREMITY TREATMENT   Patient Name: Jesse Bass MRN: [id number] DOB:11-19-1965, 57 y.o., male Today's Date: 07/17/2022  END OF SESSION:  PT End of Session - 07/17/22 0931     Visit Number 3    Date for PT Re-Evaluation 07/28/22    Authorization Type Medicaid    PT Start Time 0930    PT Stop Time 1015    PT Time Calculation (min) 45 min    Activity Tolerance Patient tolerated treatment well;Patient limited by fatigue    Behavior During Therapy [facility] for tasks assessed/performed               Past Medical History:  Diagnosis Date   Morbid obesity ([place])    OSA on CPAP    Polysubstance abuse ([place])    Past Surgical History:  Procedure Laterality Date   KNEE SURGERY  2000   Left   Patient Active Problem List   Diagnosis Date Noted   Hypokalemia 08/30/2016   Thrombocytopenia ([place]) 08/30/2016   Syncope and collapse 08/30/2016   HTN (hypertension) 08/30/2016   Pedal edema 08/30/2016   Obesity, Class III, BMI 40-49.9 (morbid obesity) ([place]) 08/30/2016   Polysubstance abuse (HCC)    OSA (obstructive sleep apnea) 02/11/2011    PCP: No PCP  REFERRING PROVIDER: Park Meo  REFERRING DIAG: R29.898, E66.01, [email or web address]  THERAPY DIAG:  Muscle weakness (generalized)  Difficulty in walking, not elsewhere classified  Chronic low back pain, unspecified back pain laterality, unspecified whether sciatica present  Rationale for Evaluation and Treatment: Rehabilitation  ONSET DATE: 04/13/22  SUBJECTIVE:   SUBJECTIVE STATEMENT: I think I slept awkward, I am sore from working out. The knee is hurting today.   PERTINENT HISTORY: Morbid obesity  PAIN:  Are you having pain? Yes: NPRS scale: 9/10 Pain location: both knees and low back Pain description: chronic, constant ache Aggravating factors: exercises  Relieving factors: pain meds, laying down  PRECAUTIONS: None  WEIGHT BEARING RESTRICTIONS: No  FALLS:  Has patient fallen in  last 6 months? No  LIVING ENVIRONMENT: Lives with: lives alone Lives in: House/apartment Stairs: No Has following equipment at home: Environmental consultant - 2 wheeled, Crutches, and Wheelchair (manual)  OCCUPATION: not employed  PLOF: Independent with basic ADLs and Independent with household mobility without device  PATIENT GOALS: Getting out of the wheelchair, work on my legs  NEXT MD VISIT:   OBJECTIVE:   COGNITION: Overall cognitive status: Within functional limits for tasks assessed     SENSATION: WFL  MUSCLE LENGTH: Hamstrings: bilateral tightness  POSTURE: rounded shoulders, increased lumbar lordosis, and flexed trunk    LOWER EXTREMITY ROM: grossly WFL, L knee extension limited to 10d   LOWER EXTREMITY MMT:  MMT Right eval Left eval  Hip flexion 4 4  Hip extension    Hip abduction    Hip adduction    Hip internal rotation    Hip external rotation    Knee flexion 4 4  Knee extension 3+ 3+  Ankle dorsiflexion 5 5  Ankle plantarflexion    Ankle inversion    Ankle eversion     (Blank rows = not tested)   FUNCTIONAL TESTS:  5 times sit to stand: 29.77s Timed up and go (TUG): 23.06s  GAIT: Distance walked: 39ft Assistive device utilized: None Level of assistance: Modified independence Comments: decreased step and stance length, antalgic gait, flexed trunk   TODAY'S TREATMENT:  DATE:  07/17/22 Pball roll outs 2x5 Standing marching x10  LAQ x10 each side STS with OHP 2x5 Walking 80ft without AD, flexed trunk and decreased step length, antalgic Standing shoulder extension and rows green x10 NuStep L4 x86mins    07/08/22 Reassess 5xSTS and TUG  NuStep L5 x34mins STS with chest press 2x10 Standing hip flexion and abd x10 Walking 3 laps around gym    05/12/22- EVAL    PATIENT EDUCATION:  Education details: POC, aquatic  therapy Person educated: Patient Education method: Explanation Education comprehension: verbalized understanding  HOME EXERCISE PROGRAM: TBD  ASSESSMENT:  CLINICAL IMPRESSION: Patient returns with increased pain in L knee, has difficulty with exercises today due to this. Worked on some low level LE functional strengthening and walking but he is very slow with all movements due to pain.   OBJECTIVE IMPAIRMENTS: Abnormal gait, decreased activity tolerance, decreased endurance, decreased mobility, difficulty walking, decreased ROM, decreased strength, obesity, and pain.   ACTIVITY LIMITATIONS: sitting, standing, squatting, stairs, transfers, and locomotion level  REHAB POTENTIAL: Good  CLINICAL DECISION MAKING: Stable/uncomplicated  EVALUATION COMPLEXITY: Low  GOALS: Goals reviewed with patient? Yes  SHORT TERM GOALS: Target date: 06/23/22  Patient will be independent with initial HEP. Goal status: MET  2.  Patient will demonstrate decreased fall risk by scoring < 15 sec on TUG. Baseline: 23.06s, 10.32s Goal status: MET   LONG TERM GOALS: Target date: 07/28/22  Patient will be independent with advanced/ongoing HEP to improve outcomes and carryover.  Goal status: INITIAL  2.  Patient will be able to ambulate 500' with good safety to access community.  Goal status: INITIAL   3.  Patient will demonstrate improved functional LE strength as demonstrated by 5xSTS to < 15s. Baseline: 29.77s, 16.78s Goal status: IN PROGRESS   PLAN:  PT FREQUENCY: 2x/week  PT DURATION: 10 weeks  PLANNED INTERVENTIONS: Therapeutic exercises, Therapeutic activity, Neuromuscular re-education, Balance training, Gait training, Patient/Family education, Self Care, Joint mobilization, Stair training, Aquatic Therapy, Cryotherapy, Moist heat, and Manual therapy  PLAN FOR NEXT SESSION: start gym activities,    [place], PT 07/17/2022, 10:17 AM

## 2022-07-17 ENCOUNTER — Ambulatory Visit: Payer: Medicare Other | Attending: Physician Assistant

## 2022-07-17 DIAGNOSIS — M545 Low back pain, unspecified: Secondary | ICD-10-CM | POA: Insufficient documentation

## 2022-07-17 DIAGNOSIS — M6281 Muscle weakness (generalized): Secondary | ICD-10-CM | POA: Insufficient documentation

## 2022-07-17 DIAGNOSIS — R262 Difficulty in walking, not elsewhere classified: Secondary | ICD-10-CM | POA: Diagnosis present

## 2022-07-17 DIAGNOSIS — G8929 Other chronic pain: Secondary | ICD-10-CM | POA: Insufficient documentation

## 2022-07-30 ENCOUNTER — Ambulatory Visit: Payer: Medicare Other

## 2022-08-10 ENCOUNTER — Ambulatory Visit: Payer: Medicare Other

## 2022-08-17 ENCOUNTER — Other Ambulatory Visit: Payer: Self-pay

## 2022-08-17 ENCOUNTER — Ambulatory Visit: Payer: Medicare Other | Attending: Physician Assistant

## 2022-08-17 DIAGNOSIS — R262 Difficulty in walking, not elsewhere classified: Secondary | ICD-10-CM

## 2022-08-17 DIAGNOSIS — R252 Cramp and spasm: Secondary | ICD-10-CM | POA: Diagnosis present

## 2022-08-17 DIAGNOSIS — M6281 Muscle weakness (generalized): Secondary | ICD-10-CM | POA: Diagnosis not present

## 2022-08-17 DIAGNOSIS — G8929 Other chronic pain: Secondary | ICD-10-CM | POA: Insufficient documentation

## 2022-08-17 DIAGNOSIS — M545 Low back pain, unspecified: Secondary | ICD-10-CM | POA: Diagnosis present

## 2022-08-17 NOTE — Therapy (Signed)
OUTPATIENT PHYSICAL THERAPY LOWER EXTREMITY TREATMENT  Progress Note Reporting Period 07/17/22 to 08/17/22  See note below for Objective Data and Assessment of Progress/Goals.     Patient Name: Jesse Bass MRN: [id number] DOB:1965/07/03, 57 y.o., male Today's Date: 08/17/2022  END OF SESSION:  PT End of Session - 08/17/22 1309     Date for PT Re-Evaluation 09/28/22    Authorization Type Medicaid    Activity Tolerance Patient tolerated treatment well;Patient limited by fatigue    Behavior During Therapy [facility] for tasks assessed/performed               Past Medical History:  Diagnosis Date   Morbid obesity    OSA on CPAP    Polysubstance abuse    Past Surgical History:  Procedure Laterality Date   KNEE SURGERY  2000   Left   Patient Active Problem List   Diagnosis Date Noted   Hypokalemia 08/30/2016   Thrombocytopenia 08/30/2016   Syncope and collapse 08/30/2016   HTN (hypertension) 08/30/2016   Pedal edema 08/30/2016   Obesity, Class III, BMI 40-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 (morbid obesity) 08/30/2016   Polysubstance abuse    OSA (obstructive sleep apnea) 02/11/2011    PCP: No PCP  REFERRING PROVIDER: Sherrie Mustache  REFERRING DIAG: R29.898, E66.01, Z68.44  THERAPY DIAG:  Muscle weakness (generalized)  Difficulty in walking, not elsewhere classified  Chronic low back pain, unspecified back pain laterality, unspecified whether sciatica present  Cramp and spasm  Rationale for Evaluation and Treatment: Rehabilitation  ONSET DATE: 04/13/22  SUBJECTIVE:   SUBJECTIVE STATEMENT: I've been working out at home, sore, trying to walk as much as I can  PERTINENT HISTORY: Morbid obesity  PAIN:  Are you having pain? Yes: NPRS scale: 9/10 Pain location: both knees and low back Pain description: chronic, constant ache Aggravating factors: exercises  Relieving factors: pain meds, laying down  PRECAUTIONS: None  WEIGHT BEARING RESTRICTIONS: No  FALLS:  Has patient fallen in  last 6 months? No  LIVING ENVIRONMENT: Lives with: lives alone Lives in: House/apartment Stairs: No Has following equipment at home: Environmental consultant - 2 wheeled, Crutches, and Wheelchair (manual)  OCCUPATION: not employed  PLOF: Independent with basic ADLs and Independent with household mobility without device  PATIENT GOALS: Getting out of the wheelchair, work on my legs  NEXT MD VISIT:   OBJECTIVE:   COGNITION: Overall cognitive status: Within functional limits for tasks assessed     SENSATION: WFL  MUSCLE LENGTH: Hamstrings: bilateral tightness  POSTURE: rounded shoulders, increased lumbar lordosis, and flexed trunk    LOWER EXTREMITY ROM: grossly WFL, L knee extension limited to 10d   LOWER EXTREMITY MMT:  MMT Right eval Left eval  Hip flexion 4 4  Hip extension    Hip abduction    Hip adduction    Hip internal rotation    Hip external rotation    Knee flexion 4 4  Knee extension 3+ 3+  Ankle dorsiflexion 5 5  Ankle plantarflexion    Ankle inversion    Ankle eversion     (Blank rows = not tested)   FUNCTIONAL TESTS:  5 times sit to stand: 29.77s Timed up and go (TUG): 23.06s  GAIT: Distance walked: 51ft Assistive device utilized: None Level of assistance: Modified independence Comments: decreased step and stance length, antalgic gait, flexed trunk   TODAY'S TREATMENT:  DATE:  08/17/22:  Large blue therapeutic bal roll outs, and rotations  Nustep L4, x 5 min, UE's and LE's Sit to stand from mat table no UE support 5 x Long arc quads 10 x each leg , 2 sets  Standing toe raises with rollator 20x Standing marching in place in rollator 15 x   07/17/22 Pball roll outs 2x5 Standing marching x10  LAQ x10 each side STS with OHP 2x5 Walking 63ft without AD, flexed trunk and decreased step length, antalgic Standing shoulder extension  and rows green x10 NuStep L4 x22mins    07/08/22 Reassess 5xSTS and TUG  NuStep L5 x27mins STS with chest press 2x10 Standing hip flexion and abd x10 Walking 3 laps around gym    05/12/22- EVAL    PATIENT EDUCATION:  Education details: POC, aquatic therapy Person educated: Patient Education method: Explanation Education comprehension: verbalized understanding  HOME EXERCISE PROGRAM: TBD  ASSESSMENT:  CLINICAL IMPRESSION: Patient returned today after one month absence from PT, reports due to illness, transportation issues.  He states he is motivated to continue to improve his strength and mobility for his B LE's.  Reassessed 5 x sit to stand and gait.  He hs declined somewhat but hasn't attended consistently for continuity.  Extended his care plan for 6 more weeks.  OBJECTIVE IMPAIRMENTS: Abnormal gait, decreased activity tolerance, decreased endurance, decreased mobility, difficulty walking, decreased ROM, decreased strength, obesity, and pain.   ACTIVITY LIMITATIONS: sitting, standing, squatting, stairs, transfers, and locomotion level  REHAB POTENTIAL: Good  CLINICAL DECISION MAKING: Stable/uncomplicated  EVALUATION COMPLEXITY: Low  GOALS: Goals reviewed with patient? Yes  SHORT TERM GOALS: Target date: 06/23/22  Patient will be independent with initial HEP. Goal status: MET  2.  Patient will demonstrate decreased fall risk by scoring < 15 sec on TUG. Baseline: 23.06s, 10.32s Goal status: MET   LONG TERM GOALS: Target date: 07/28/22  Patient will be independent with advanced/ongoing HEP to improve outcomes and carryover.  Goal status: IN PROGRESS  2.  Patient will be able to ambulate 500' with good safety to access community.  Goal status: IN PROGRESS able to walk today x 70' in the department, to and from parking lot   3.  Patient will demonstrate improved functional LE strength as demonstrated by 5xSTS to < 15s. Baseline: 29.77s, 16.78s Goal status: IN  PROGRESS  08/17/22: 47.33 sec , less than last assessment   PLAN:  PT FREQUENCY: 2x/week  PT DURATION: 10 weeks  PLANNED INTERVENTIONS: Therapeutic exercises, Therapeutic activity, Neuromuscular re-education, Balance training, Gait training, Patient/Family education, Self Care, Joint mobilization, Stair training, Aquatic Therapy, Cryotherapy, Moist heat, and Manual therapy  PLAN FOR NEXT SESSION: gym activities, endurance training   Reagan Behlke L Faustina Gebert, PT 08/17/2022, 1:19 PM

## 2022-08-27 ENCOUNTER — Emergency Department (HOSPITAL_COMMUNITY)
Admission: EM | Admit: 2022-08-27 | Discharge: 2022-08-27 | Disposition: A | Discharge status: Home / Self Care | Payer: Medicare Other | Attending: Emergency Medicine | Admitting: Emergency Medicine

## 2022-08-27 ENCOUNTER — Encounter (HOSPITAL_COMMUNITY): Payer: Self-pay | Admitting: Emergency Medicine

## 2022-08-27 ENCOUNTER — Other Ambulatory Visit: Payer: Self-pay

## 2022-08-27 DIAGNOSIS — M549 Dorsalgia, unspecified: Secondary | ICD-10-CM | POA: Diagnosis present

## 2022-08-27 DIAGNOSIS — M546 Pain in thoracic spine: Secondary | ICD-10-CM | POA: Diagnosis not present

## 2022-08-27 DIAGNOSIS — I10 Essential (primary) hypertension: Secondary | ICD-10-CM | POA: Diagnosis not present

## 2022-08-27 DIAGNOSIS — Z79899 Other long term (current) drug therapy: Secondary | ICD-10-CM | POA: Insufficient documentation

## 2022-08-27 MED ORDER — NAPROXEN 500 MG PO TABS
500.0000 mg | ORAL_TABLET | Freq: Once | ORAL | Status: AC
  Administered 2022-08-27: 500 mg via ORAL
  Filled 2022-08-27: qty 1

## 2022-08-27 MED ORDER — MELOXICAM 15 MG PO TABS: 15.0000 mg | ORAL_TABLET | Freq: Every day | ORAL | 0 refills | Status: AC

## 2022-08-27 MED ORDER — METHYLPREDNISOLONE 4 MG PO TBPK: ORAL_TABLET | ORAL | 0 refills | Status: AC

## 2022-08-27 MED ORDER — AMLODIPINE BESYLATE 10 MG PO TABS: 10.0000 mg | ORAL_TABLET | Freq: Every day | ORAL | 2 refills | Status: AC

## 2022-08-27 MED ORDER — LIDOCAINE 5 % EX PTCH
1.0000 | MEDICATED_PATCH | CUTANEOUS | Status: DC
  Administered 2022-08-27: 1 via TRANSDERMAL
  Filled 2022-08-27: qty 1

## 2022-08-27 MED ORDER — CYCLOBENZAPRINE HCL 10 MG PO TABS: 10.0000 mg | ORAL_TABLET | Freq: Two times a day (BID) | ORAL | 0 refills | Status: AC | PRN

## 2022-08-27 MED ORDER — AMLODIPINE BESYLATE 5 MG PO TABS
10.0000 mg | ORAL_TABLET | Freq: Once | ORAL | Status: AC
  Administered 2022-08-27: 10 mg via ORAL
  Filled 2022-08-27: qty 2

## 2022-08-27 NOTE — Discharge Instructions (Addendum)
You were evaluated today for back pain.  This seems consistent with a muscle strain.  Please apply heat to the area as you are able.  I prescribed Flexeril which is a muscle relaxant.  As discussed, please take this only when you are sure that you are not going to operate a motor vehicle as this will likely cause drowsiness.  I have also prescribed a Medrol Dosepak and meloxicam for inflammation. Do not take other NSAID medications while taking the meloxicam.  You may also consider over-the-counter pain patches for relief. I refilled your amlodipine for your hypertension.  Please follow-up with your primary care for further evaluation, preferably in 1 week.
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
## 2022-08-28 ENCOUNTER — Ambulatory Visit: Payer: Medicare Other

## 2022-09-02 ENCOUNTER — Ambulatory Visit: Payer: Medicare Other

## 2022-09-09 ENCOUNTER — Ambulatory Visit: Payer: Medicare Other

## 2022-09-15 ENCOUNTER — Ambulatory Visit: Payer: Medicare Other

## 2022-09-29 ENCOUNTER — Ambulatory Visit: Payer: Medicare Other
